# Patient Record
Sex: Male | Born: 1950
Health system: Southern US, Community
[De-identification: ages and names within clinical notes are randomized; demographics above are authoritative.]

## PROBLEM LIST (undated history)

## (undated) DIAGNOSIS — R0602 Shortness of breath: Secondary | ICD-10-CM

## (undated) DIAGNOSIS — Z8709 Personal history of other diseases of the respiratory system: Secondary | ICD-10-CM

## (undated) DIAGNOSIS — K219 Gastro-esophageal reflux disease without esophagitis: Secondary | ICD-10-CM

## (undated) DIAGNOSIS — I10 Essential (primary) hypertension: Secondary | ICD-10-CM

## (undated) DIAGNOSIS — E785 Hyperlipidemia, unspecified: Secondary | ICD-10-CM

## (undated) DIAGNOSIS — R51 Headache: Secondary | ICD-10-CM

## (undated) DIAGNOSIS — Z720 Tobacco use: Secondary | ICD-10-CM

## (undated) DIAGNOSIS — I2510 Atherosclerotic heart disease of native coronary artery without angina pectoris: Secondary | ICD-10-CM

## (undated) DIAGNOSIS — L409 Psoriasis, unspecified: Secondary | ICD-10-CM

## (undated) HISTORY — PX: LEG SURGERY: SHX1003

## (undated) HISTORY — PX: LIVER SURGERY: SHX698

## (undated) HISTORY — PX: SPLENECTOMY, TOTAL: SHX788

---

## 1978-08-10 HISTORY — PX: LUMBAR SPINE SURGERY: SHX701

## 2012-08-19 ENCOUNTER — Other Ambulatory Visit: Payer: Self-pay | Admitting: Neurosurgery

## 2012-08-19 ENCOUNTER — Ambulatory Visit
Admission: RE | Admit: 2012-08-19 | Discharge: 2012-08-19 | Disposition: A | Discharge status: Home / Self Care | Payer: Self-pay | Source: Ambulatory Visit | Attending: Neurosurgery | Admitting: Neurosurgery

## 2012-08-19 DIAGNOSIS — M542 Cervicalgia: Secondary | ICD-10-CM

## 2012-12-27 ENCOUNTER — Other Ambulatory Visit: Payer: Self-pay | Admitting: Neurosurgery

## 2012-12-27 DIAGNOSIS — M5412 Radiculopathy, cervical region: Secondary | ICD-10-CM

## 2012-12-28 ENCOUNTER — Ambulatory Visit
Admission: RE | Admit: 2012-12-28 | Discharge: 2012-12-28 | Disposition: A | Discharge status: Home / Self Care | Payer: No Typology Code available for payment source | Source: Ambulatory Visit | Attending: Neurosurgery | Admitting: Neurosurgery

## 2012-12-28 DIAGNOSIS — M5412 Radiculopathy, cervical region: Secondary | ICD-10-CM

## 2012-12-28 MED ORDER — GADOBENATE DIMEGLUMINE 529 MG/ML IV SOLN
15.0000 mL | Freq: Once | INTRAVENOUS | Status: AC | PRN
  Administered 2012-12-28: 15 mL via INTRAVENOUS

## 2013-01-04 ENCOUNTER — Other Ambulatory Visit: Payer: Self-pay | Admitting: Neurosurgery

## 2013-01-06 ENCOUNTER — Encounter (HOSPITAL_COMMUNITY): Admission: RE | Admit: 2013-01-06 | Payer: Self-pay | Source: Ambulatory Visit

## 2013-01-11 ENCOUNTER — Encounter (HOSPITAL_COMMUNITY): Admission: RE | Payer: Self-pay | Source: Ambulatory Visit

## 2013-01-11 ENCOUNTER — Ambulatory Visit (HOSPITAL_COMMUNITY): Admission: RE | Admit: 2013-01-11 | Payer: Self-pay | Source: Ambulatory Visit | Admitting: Neurosurgery

## 2013-01-11 SURGERY — ANTERIOR CERVICAL DECOMPRESSION/DISCECTOMY FUSION 1 LEVEL
Anesthesia: General

## 2013-04-18 ENCOUNTER — Other Ambulatory Visit: Payer: Self-pay | Admitting: Neurosurgery

## 2013-05-12 ENCOUNTER — Other Ambulatory Visit (HOSPITAL_COMMUNITY): Payer: Self-pay | Admitting: *Deleted

## 2013-05-12 NOTE — Pre-Procedure Instructions (Signed)
Gabriel Gibbs  05/12/2013   Your procedure is scheduled on:  Wednesday, May 24, 2013 at 9:00 AM.   Report to [facility] Main Entrance "A" at 7:00 AM.   Call this number if you have problems the morning of surgery: [phone]   Remember:   Do not eat food or drink liquids after midnight Tuesday, 05/23/13.   Take these medicines the morning of surgery with A SIP OF WATER: oxyCODONE-acetaminophen (PERCOCET) - if needed, methylPREDNISolone (MEDROL DOSEPAK)     Do not wear jewelry.  Do not wear lotions, powders, or cologne. You may wear deodorant.  Do not shave 48 hours prior to surgery. Men may shave face and neck.  Do not bring valuables to the hospital.  [facility] is not responsible                  for any belongings or valuables.               Contacts, dentures or bridgework may not be worn into surgery.  Leave suitcase in the car. After surgery it may be brought to your room.  For patients admitted to the hospital, discharge time is determined by your                treatment team.              Special Instructions: Shower using CHG 2 nights before surgery and the night before surgery.  If you shower the day of surgery use CHG.  Use special wash - you have one bottle of CHG for all showers.  You should use approximately 1/3 of the bottle for each shower.   Please read over the following fact sheets that you were given: Pain Booklet, Coughing and Deep Breathing, MRSA Information and Surgical Site Infection Prevention

## 2013-05-15 ENCOUNTER — Encounter (HOSPITAL_COMMUNITY): Payer: Self-pay | Admitting: Pharmacy Technician

## 2013-05-15 ENCOUNTER — Ambulatory Visit (HOSPITAL_COMMUNITY)
Admission: RE | Admit: 2013-05-15 | Discharge: 2013-05-15 | Disposition: A | Discharge status: Home / Self Care | Payer: PRIVATE HEALTH INSURANCE | Source: Ambulatory Visit | Attending: Anesthesiology | Admitting: Anesthesiology

## 2013-05-15 ENCOUNTER — Encounter (HOSPITAL_COMMUNITY): Payer: Self-pay

## 2013-05-15 ENCOUNTER — Encounter (HOSPITAL_COMMUNITY)
Admission: RE | Admit: 2013-05-15 | Discharge: 2013-05-15 | Disposition: A | Discharge status: Home / Self Care | Payer: PRIVATE HEALTH INSURANCE | Source: Ambulatory Visit | Attending: Neurosurgery | Admitting: Neurosurgery

## 2013-05-15 DIAGNOSIS — Z01818 Encounter for other preprocedural examination: Secondary | ICD-10-CM | POA: Insufficient documentation

## 2013-05-15 DIAGNOSIS — Z01810 Encounter for preprocedural cardiovascular examination: Secondary | ICD-10-CM | POA: Insufficient documentation

## 2013-05-15 DIAGNOSIS — Z01812 Encounter for preprocedural laboratory examination: Secondary | ICD-10-CM | POA: Insufficient documentation

## 2013-05-15 DIAGNOSIS — R9431 Abnormal electrocardiogram [ECG] [EKG]: Secondary | ICD-10-CM | POA: Insufficient documentation

## 2013-05-15 HISTORY — DX: Shortness of breath: R06.02

## 2013-05-15 HISTORY — DX: Headache: R51

## 2013-05-15 HISTORY — DX: Personal history of other diseases of the respiratory system: Z87.09

## 2013-05-15 HISTORY — DX: Psoriasis, unspecified: L40.9

## 2013-05-15 LAB — BASIC METABOLIC PANEL
BUN: 23 mg/dL (ref 6–23)
Calcium: 9 mg/dL (ref 8.4–10.5)
Chloride: 105 mEq/L (ref 96–112)
Creatinine, Ser: 0.69 mg/dL (ref 0.50–1.35)
GFR calc Af Amer: >90 mL/min (ref >90)
Sodium: 141 mEq/L (ref 135–145)

## 2013-05-15 LAB — CBC
MCH: 34 pg (ref 26.0–34.0)
MCV: 95.7 fL (ref 78.0–100.0)
Platelets: 338 10*3/uL (ref 150–400)
RBC: 4.68 MIL/uL (ref 4.22–5.81)
RDW: 13.7 % (ref 11.5–15.5)
WBC: 9.4 10*3/uL (ref 4.0–10.5)

## 2013-05-15 LAB — SURGICAL PCR SCREEN: Staphylococcus aureus: NEGATIVE

## 2013-05-23 MED ORDER — CEFAZOLIN SODIUM-DEXTROSE 2-3 GM-% IV SOLR
2.0000 g | INTRAVENOUS | Status: AC
  Administered 2013-05-24: 2 g via INTRAVENOUS
  Filled 2013-05-23: qty 50

## 2013-05-24 ENCOUNTER — Encounter (HOSPITAL_COMMUNITY): Payer: Self-pay | Admitting: Anesthesiology

## 2013-05-24 ENCOUNTER — Inpatient Hospital Stay (HOSPITAL_COMMUNITY)
Admission: RE | Admit: 2013-05-24 | Discharge: 2013-05-26 | DRG: 473 | Disposition: A | Discharge status: Home / Self Care | Payer: PRIVATE HEALTH INSURANCE | Source: Ambulatory Visit | Attending: Neurosurgery | Admitting: Neurosurgery

## 2013-05-24 ENCOUNTER — Ambulatory Visit (HOSPITAL_COMMUNITY): Payer: PRIVATE HEALTH INSURANCE

## 2013-05-24 ENCOUNTER — Encounter (HOSPITAL_COMMUNITY)
Admission: RE | Disposition: A | Discharge status: Home / Self Care | Payer: Self-pay | Source: Ambulatory Visit | Attending: Neurosurgery

## 2013-05-24 ENCOUNTER — Encounter (HOSPITAL_COMMUNITY): Payer: PRIVATE HEALTH INSURANCE | Admitting: Anesthesiology

## 2013-05-24 ENCOUNTER — Ambulatory Visit (HOSPITAL_COMMUNITY): Payer: PRIVATE HEALTH INSURANCE | Admitting: Anesthesiology

## 2013-05-24 DIAGNOSIS — R110 Nausea: Secondary | ICD-10-CM | POA: Diagnosis not present

## 2013-05-24 DIAGNOSIS — L408 Other psoriasis: Secondary | ICD-10-CM | POA: Diagnosis present

## 2013-05-24 DIAGNOSIS — F17200 Nicotine dependence, unspecified, uncomplicated: Secondary | ICD-10-CM | POA: Diagnosis present

## 2013-05-24 DIAGNOSIS — Z79899 Other long term (current) drug therapy: Secondary | ICD-10-CM

## 2013-05-24 DIAGNOSIS — M47812 Spondylosis without myelopathy or radiculopathy, cervical region: Principal | ICD-10-CM | POA: Diagnosis present

## 2013-05-24 HISTORY — PX: ANTERIOR CERVICAL DECOMP/DISCECTOMY FUSION: SHX1161

## 2013-05-24 SURGERY — ANTERIOR CERVICAL DECOMPRESSION/DISCECTOMY FUSION 1 LEVEL
Anesthesia: General | Site: Spine Cervical | Wound class: Clean

## 2013-05-24 MED ORDER — THROMBIN 5000 UNITS EX SOLR
CUTANEOUS | Status: DC | PRN
  Administered 2013-05-24 (×2): 5000 [IU] via TOPICAL

## 2013-05-24 MED ORDER — ONDANSETRON HCL 4 MG/2ML IJ SOLN
INTRAMUSCULAR | Status: DC | PRN
  Administered 2013-05-24: 4 mg via INTRAMUSCULAR

## 2013-05-24 MED ORDER — ACETAMINOPHEN 650 MG RE SUPP: 650.0000 mg | RECTAL | Status: DC | PRN

## 2013-05-24 MED ORDER — OXYCODONE HCL 5 MG PO TABS
ORAL_TABLET | ORAL | Status: AC
  Filled 2013-05-24: qty 1

## 2013-05-24 MED ORDER — SODIUM CHLORIDE 0.9 % IJ SOLN
3.0000 mL | Freq: Two times a day (BID) | INTRAMUSCULAR | Status: DC
  Administered 2013-05-24 – 2013-05-25 (×4): 3 mL via INTRAVENOUS

## 2013-05-24 MED ORDER — GLYCOPYRROLATE 0.2 MG/ML IJ SOLN
INTRAMUSCULAR | Status: DC | PRN
  Administered 2013-05-24: 0.4 mg via INTRAVENOUS

## 2013-05-24 MED ORDER — MIDAZOLAM HCL 5 MG/5ML IJ SOLN
INTRAMUSCULAR | Status: DC | PRN
  Administered 2013-05-24 (×2): 2 mg via INTRAVENOUS

## 2013-05-24 MED ORDER — LIDOCAINE HCL (CARDIAC) 20 MG/ML IV SOLN
INTRAVENOUS | Status: DC | PRN
  Administered 2013-05-24: 60 mg via INTRAVENOUS

## 2013-05-24 MED ORDER — ONDANSETRON HCL 4 MG/2ML IJ SOLN
4.0000 mg | INTRAMUSCULAR | Status: DC | PRN
  Administered 2013-05-25: 4 mg via INTRAVENOUS
  Filled 2013-05-24: qty 2

## 2013-05-24 MED ORDER — SODIUM CHLORIDE 0.9 % IJ SOLN: 3.0000 mL | INTRAMUSCULAR | Status: DC | PRN

## 2013-05-24 MED ORDER — OXYCODONE-ACETAMINOPHEN 5-325 MG PO TABS
ORAL_TABLET | ORAL | Status: AC
  Filled 2013-05-24: qty 1

## 2013-05-24 MED ORDER — NEOSTIGMINE METHYLSULFATE 1 MG/ML IJ SOLN
INTRAMUSCULAR | Status: DC | PRN
  Administered 2013-05-24: 3 mg via INTRAVENOUS

## 2013-05-24 MED ORDER — DEXAMETHASONE SODIUM PHOSPHATE 10 MG/ML IJ SOLN
10.0000 mg | Freq: Once | INTRAMUSCULAR | Status: AC
  Administered 2013-05-24: 10 mg via INTRAVENOUS
  Filled 2013-05-24 (×2): qty 1

## 2013-05-24 MED ORDER — HYDROMORPHONE HCL PF 1 MG/ML IJ SOLN
INTRAMUSCULAR | Status: AC
  Filled 2013-05-24: qty 1

## 2013-05-24 MED ORDER — ALBUTEROL SULFATE HFA 108 (90 BASE) MCG/ACT IN AERS
2.0000 | INHALATION_SPRAY | Freq: Four times a day (QID) | RESPIRATORY_TRACT | Status: DC | PRN
  Filled 2013-05-24: qty 6.7

## 2013-05-24 MED ORDER — ROCURONIUM BROMIDE 100 MG/10ML IV SOLN
INTRAVENOUS | Status: DC | PRN
  Administered 2013-05-24: 50 mg via INTRAVENOUS

## 2013-05-24 MED ORDER — DOCUSATE SODIUM 100 MG PO CAPS
100.0000 mg | ORAL_CAPSULE | Freq: Two times a day (BID) | ORAL | Status: DC
  Administered 2013-05-24 – 2013-05-25 (×3): 100 mg via ORAL
  Filled 2013-05-24 (×3): qty 1

## 2013-05-24 MED ORDER — THROMBIN 5000 UNITS EX SOLR
OROMUCOSAL | Status: DC | PRN
  Administered 2013-05-24: 11:00:00 via TOPICAL

## 2013-05-24 MED ORDER — HYDROMORPHONE HCL PF 1 MG/ML IJ SOLN
0.5000 mg | INTRAMUSCULAR | Status: DC | PRN
  Administered 2013-05-24 – 2013-05-25 (×5): 1 mg via INTRAVENOUS
  Filled 2013-05-24 (×5): qty 1

## 2013-05-24 MED ORDER — LACTATED RINGERS IV SOLN
INTRAVENOUS | Status: DC | PRN
  Administered 2013-05-24 (×2): via INTRAVENOUS

## 2013-05-24 MED ORDER — OXYCODONE-ACETAMINOPHEN 5-325 MG PO TABS
1.0000 | ORAL_TABLET | ORAL | Status: DC | PRN
  Administered 2013-05-24 – 2013-05-26 (×4): 1 via ORAL
  Filled 2013-05-24 (×4): qty 1

## 2013-05-24 MED ORDER — 0.9 % SODIUM CHLORIDE (POUR BTL) OPTIME
TOPICAL | Status: DC | PRN
  Administered 2013-05-24: 1000 mL

## 2013-05-24 MED ORDER — OXYCODONE HCL 5 MG PO TABS
5.0000 mg | ORAL_TABLET | ORAL | Status: DC | PRN
  Administered 2013-05-24 – 2013-05-26 (×4): 5 mg via ORAL
  Filled 2013-05-24 (×4): qty 1

## 2013-05-24 MED ORDER — ONDANSETRON HCL 4 MG/2ML IJ SOLN: 4.0000 mg | Freq: Once | INTRAMUSCULAR | Status: DC | PRN

## 2013-05-24 MED ORDER — OXYCODONE-ACETAMINOPHEN 5-325 MG PO TABS
1.0000 | ORAL_TABLET | Freq: Four times a day (QID) | ORAL | Status: DC | PRN
  Administered 2013-05-24: 1 via ORAL

## 2013-05-24 MED ORDER — FENTANYL CITRATE 0.05 MG/ML IJ SOLN
INTRAMUSCULAR | Status: DC | PRN
  Administered 2013-05-24: 50 ug via INTRAVENOUS
  Administered 2013-05-24 (×2): 100 ug via INTRAVENOUS

## 2013-05-24 MED ORDER — DEXAMETHASONE SODIUM PHOSPHATE 10 MG/ML IJ SOLN
10.0000 mg | INTRAMUSCULAR | Status: AC
  Administered 2013-05-24: 10 mg via INTRAVENOUS
  Filled 2013-05-24: qty 1

## 2013-05-24 MED ORDER — ALUM & MAG HYDROXIDE-SIMETH 200-200-20 MG/5ML PO SUSP
30.0000 mL | Freq: Four times a day (QID) | ORAL | Status: DC | PRN
  Administered 2013-05-26: 30 mL via ORAL
  Filled 2013-05-24: qty 30

## 2013-05-24 MED ORDER — CARISOPRODOL 350 MG PO TABS
350.0000 mg | ORAL_TABLET | Freq: Four times a day (QID) | ORAL | Status: DC
  Administered 2013-05-24 – 2013-05-25 (×6): 350 mg via ORAL
  Filled 2013-05-24 (×6): qty 1

## 2013-05-24 MED ORDER — PROPOFOL 10 MG/ML IV BOLUS
INTRAVENOUS | Status: DC | PRN
  Administered 2013-05-24: 160 mg via INTRAVENOUS

## 2013-05-24 MED ORDER — SODIUM CHLORIDE 0.9 % IR SOLN
Status: DC | PRN
  Administered 2013-05-24: 09:00:00

## 2013-05-24 MED ORDER — DEXAMETHASONE SODIUM PHOSPHATE 10 MG/ML IJ SOLN
10.0000 mg | Freq: Four times a day (QID) | INTRAMUSCULAR | Status: DC
  Administered 2013-05-24 – 2013-05-26 (×6): 10 mg via INTRAVENOUS
  Filled 2013-05-24 (×11): qty 1

## 2013-05-24 MED ORDER — CYCLOBENZAPRINE HCL 10 MG PO TABS
ORAL_TABLET | ORAL | Status: AC
  Filled 2013-05-24: qty 1

## 2013-05-24 MED ORDER — HYDROMORPHONE HCL PF 1 MG/ML IJ SOLN
INTRAMUSCULAR | Status: DC | PRN
  Administered 2013-05-24 (×2): 0.5 mg via INTRAVENOUS

## 2013-05-24 MED ORDER — PANTOPRAZOLE SODIUM 40 MG PO TBEC
40.0000 mg | DELAYED_RELEASE_TABLET | Freq: Two times a day (BID) | ORAL | Status: DC
  Administered 2013-05-24 – 2013-05-25 (×3): 40 mg via ORAL
  Filled 2013-05-24 (×3): qty 1

## 2013-05-24 MED ORDER — MENTHOL 3 MG MT LOZG: 1.0000 | LOZENGE | OROMUCOSAL | Status: DC | PRN

## 2013-05-24 MED ORDER — CYCLOBENZAPRINE HCL 10 MG PO TABS
10.0000 mg | ORAL_TABLET | Freq: Three times a day (TID) | ORAL | Status: DC | PRN
  Administered 2013-05-24 – 2013-05-25 (×3): 10 mg via ORAL
  Filled 2013-05-24 (×2): qty 1

## 2013-05-24 MED ORDER — OXYCODONE-ACETAMINOPHEN 10-325 MG PO TABS: 1.0000 | ORAL_TABLET | Freq: Four times a day (QID) | ORAL | Status: DC | PRN

## 2013-05-24 MED ORDER — HEMOSTATIC AGENTS (NO CHARGE) OPTIME
TOPICAL | Status: DC | PRN
  Administered 2013-05-24: 1 via TOPICAL

## 2013-05-24 MED ORDER — PHENOL 1.4 % MT LIQD: 1.0000 | OROMUCOSAL | Status: DC | PRN

## 2013-05-24 MED ORDER — CEFAZOLIN SODIUM 1-5 GM-% IV SOLN
1.0000 g | Freq: Three times a day (TID) | INTRAVENOUS | Status: AC
  Administered 2013-05-24 – 2013-05-25 (×2): 1 g via INTRAVENOUS
  Filled 2013-05-24 (×2): qty 50

## 2013-05-24 MED ORDER — LACTATED RINGERS IV SOLN
INTRAVENOUS | Status: DC
Start: 2013-05-24 — End: 2013-05-26
  Administered 2013-05-24: 08:00:00 via INTRAVENOUS

## 2013-05-24 MED ORDER — ACETAMINOPHEN 325 MG PO TABS: 650.0000 mg | ORAL_TABLET | ORAL | Status: DC | PRN

## 2013-05-24 MED ORDER — OXYCODONE HCL 5 MG PO TABS
5.0000 mg | ORAL_TABLET | Freq: Four times a day (QID) | ORAL | Status: DC | PRN
  Administered 2013-05-24: 5 mg via ORAL

## 2013-05-24 MED ORDER — HYDROMORPHONE HCL PF 1 MG/ML IJ SOLN
0.2500 mg | INTRAMUSCULAR | Status: DC | PRN
  Administered 2013-05-24 (×4): 0.5 mg via INTRAVENOUS

## 2013-05-24 SURGICAL SUPPLY — 59 items
BAG DECANTER FOR FLEXI CONT (MISCELLANEOUS) ×2 IMPLANT
BENZOIN TINCTURE PRP APPL 2/3 (GAUZE/BANDAGES/DRESSINGS) ×2 IMPLANT
BIT DRILL SM SPINE QC 14 (BIT) ×2 IMPLANT
BONE CERV LORDOTIC 14.5X12X7 (Bone Implant) ×2 IMPLANT
BRUSH SCRUB EZ PLAIN DRY (MISCELLANEOUS) ×2 IMPLANT
BUR MATCHSTICK NEURO 3.0 LAGG (BURR) ×2 IMPLANT
CANISTER SUCTION 2500CC (MISCELLANEOUS) ×2 IMPLANT
CONT SPEC 4OZ CLIKSEAL STRL BL (MISCELLANEOUS) ×2 IMPLANT
DERMABOND ADVANCED (GAUZE/BANDAGES/DRESSINGS)
DERMABOND ADVANCED .7 DNX12 (GAUZE/BANDAGES/DRESSINGS) IMPLANT
DRAPE C-ARM 42X72 X-RAY (DRAPES) ×4 IMPLANT
DRAPE LAPAROTOMY 100X72 PEDS (DRAPES) ×2 IMPLANT
DRAPE MICROSCOPE ZEISS OPMI (DRAPES) ×2 IMPLANT
DRAPE POUCH INSTRU U-SHP 10X18 (DRAPES) ×2 IMPLANT
DRSG OPSITE 4X5.5 SM (GAUZE/BANDAGES/DRESSINGS) IMPLANT
DRSG OPSITE POSTOP 3X4 (GAUZE/BANDAGES/DRESSINGS) ×2 IMPLANT
DURAPREP 6ML APPLICATOR 50/CS (WOUND CARE) ×2 IMPLANT
ELECT COATED BLADE 2.86 ST (ELECTRODE) ×2 IMPLANT
ELECT REM PT RETURN 9FT ADLT (ELECTROSURGICAL) ×2
ELECTRODE REM PT RTRN 9FT ADLT (ELECTROSURGICAL) ×1 IMPLANT
GAUZE SPONGE 4X4 16PLY XRAY LF (GAUZE/BANDAGES/DRESSINGS) IMPLANT
GLOVE BIO SURGEON STRL SZ8 (GLOVE) ×2 IMPLANT
GLOVE BIOGEL PI IND STRL 7.0 (GLOVE) ×3 IMPLANT
GLOVE BIOGEL PI INDICATOR 7.0 (GLOVE) ×3
GLOVE ECLIPSE 6.5 STRL STRAW (GLOVE) ×2 IMPLANT
GLOVE EXAM NITRILE LRG STRL (GLOVE) IMPLANT
GLOVE EXAM NITRILE MD LF STRL (GLOVE) IMPLANT
GLOVE EXAM NITRILE XL STR (GLOVE) IMPLANT
GLOVE EXAM NITRILE XS STR PU (GLOVE) IMPLANT
GLOVE INDICATOR 8.5 STRL (GLOVE) ×2 IMPLANT
GLOVE SS BIOGEL STRL SZ 6.5 (GLOVE) ×3 IMPLANT
GLOVE SUPERSENSE BIOGEL SZ 6.5 (GLOVE) ×3
GOWN BRE IMP SLV AUR LG STRL (GOWN DISPOSABLE) ×6 IMPLANT
GOWN BRE IMP SLV AUR XL STRL (GOWN DISPOSABLE) ×2 IMPLANT
GOWN STRL REIN 2XL LVL4 (GOWN DISPOSABLE) IMPLANT
HEAD HALTER (SOFTGOODS) ×2 IMPLANT
HEMOSTAT POWDER KIT SURGIFOAM (HEMOSTASIS) ×2 IMPLANT
KIT BASIN OR (CUSTOM PROCEDURE TRAY) ×2 IMPLANT
KIT ROOM TURNOVER OR (KITS) ×2 IMPLANT
NEEDLE HYPO 18GX1.5 BLUNT FILL (NEEDLE) IMPLANT
NEEDLE SPNL 20GX3.5 QUINCKE YW (NEEDLE) ×2 IMPLANT
NS IRRIG 1000ML POUR BTL (IV SOLUTION) ×2 IMPLANT
PACK LAMINECTOMY NEURO (CUSTOM PROCEDURE TRAY) ×2 IMPLANT
PAD ARMBOARD 7.5X6 YLW CONV (MISCELLANEOUS) ×6 IMPLANT
PLATE ANT CERV XTEND 1 LV 16 (Plate) ×2 IMPLANT
RUBBERBAND STERILE (MISCELLANEOUS) ×4 IMPLANT
SCREW XTD VAR 4.2 SELF TAP (Screw) ×8 IMPLANT
SPONGE GAUZE 4X4 12PLY (GAUZE/BANDAGES/DRESSINGS) IMPLANT
SPONGE INTESTINAL PEANUT (DISPOSABLE) ×2 IMPLANT
SPONGE SURGIFOAM ABS GEL SZ50 (HEMOSTASIS) ×2 IMPLANT
STRIP CLOSURE SKIN 1/2X4 (GAUZE/BANDAGES/DRESSINGS) ×2 IMPLANT
SUT VIC AB 3-0 SH 8-18 (SUTURE) ×2 IMPLANT
SUT VICRYL 4-0 PS2 18IN ABS (SUTURE) ×2 IMPLANT
SYR 20ML ECCENTRIC (SYRINGE) ×2 IMPLANT
TAPE CLOTH 4X10 WHT NS (GAUZE/BANDAGES/DRESSINGS) IMPLANT
TOWEL OR 17X24 6PK STRL BLUE (TOWEL DISPOSABLE) ×2 IMPLANT
TOWEL OR 17X26 10 PK STRL BLUE (TOWEL DISPOSABLE) ×2 IMPLANT
TRAP SPECIMEN MUCOUS 40CC (MISCELLANEOUS) ×2 IMPLANT
WATER STERILE IRR 1000ML POUR (IV SOLUTION) ×2 IMPLANT

## 2013-05-24 NOTE — Plan of Care (Signed)
Problem: Consults Goal: Diagnosis - Spinal Surgery Outcome: Completed/Met Date Met:  05/24/13 Cervical Spine Fusion     

## 2013-05-24 NOTE — Anesthesia Postprocedure Evaluation (Signed)
  Anesthesia Post-op Note  Patient: Gabriel Gibbs  Procedure(s) Performed: Procedure(s): CERVICAL FOUR-FIVE ANTERIOR CERVICAL DISCECTOMY FUSION (N/A)  Patient Location: PACU  Anesthesia Type:General  Level of Consciousness: awake, alert , oriented and patient cooperative  Airway and Oxygen Therapy: Patient Spontanous Breathing  Post-op Pain: mild  Post-op Assessment: Post-op Vital signs reviewed, Patient's Cardiovascular Status Stable, Respiratory Function Stable, Patent Airway, No signs of Nausea or vomiting and Pain level controlled  Post-op Vital Signs: stable  Complications: No apparent anesthesia complications

## 2013-05-24 NOTE — Transfer of Care (Signed)
Immediate Anesthesia Transfer of Care Note  Patient: Gabriel Gibbs  Procedure(s) Performed: Procedure(s): CERVICAL FOUR-FIVE ANTERIOR CERVICAL DISCECTOMY FUSION (N/A)  Patient Location: PACU  Anesthesia Type:General  Level of Consciousness: awake, alert  and patient cooperative  Airway & Oxygen Therapy: Patient Spontanous Breathing and Patient connected to nasal cannula oxygen  Post-op Assessment: Report given to PACU RN, Post -op Vital signs reviewed and stable and Patient moving all extremities  Post vital signs: Reviewed and stable  Complications: No apparent anesthesia complications

## 2013-05-24 NOTE — Op Note (Signed)
Preoperative diagnosis: Cervical spondylosis with radiculopathy and cervical stenosis at C4-5 with bilateral C5 radiculopathies right greater left  Postoperative diagnosis: Same  Procedure: Anterior cervical discectomy and fusion at C4-5 using allograft wedge and globus extend plating system with 4-14 mm screws  Surgeon: Jillyn Hidden Valta Dillon  Assistant: Joylene Draft  Anesthesia: Gen.  EBL: Minimal  History of present illness: Patient is a very pleasant ?23 M. is a progress worsening neck and right shoulder greater left shoulder arm pain is been refractory to all forms of conservative treatment imaging revealed severe cervical stenosis and foraminal stenosis of both C5 nerve roots and due to patient's failure of conservative treatment imaging findings and progression of clinical syndrome I recommended anterior cervical discectomy and fusion I extensively reviewed the risks and benefits of the operation the patient as well as perioperative course expectations about alternatives of surgery and he understood and agreed to proceed forward.  Operative procedure: Patient brought into the or was induced under general anesthesia positioned supine the neck in slight extension in 5 pounds of halter traction the right side his neck was prepped and draped in routine sterile fashion. Preoperative x-ray localize the appropriate level so a curvilinear incision was made just up midline to get 4 to sternomastoid and the superficial layer of the platysmas dissected and divided longitudinally the avascular tissue sternomastoid and strap as was felt that her previously fashioned professes acid with Kitners. Interoperative x-ray confirmed the appropriate level so longus Richardson Dopp as reflected laterally and self-retaining retractors placed. Disc space was incised large anterior aspect of did not flexor rongeur and recommend Kerrison punch. The spaces and drill down the posterior annulus facet complex. Then a much illumination under biting  of the posterior aspect complex lichenification of the PLL which was removed in piecemeal fashion exposing the thecal sac. There was marked spondylosis and severe central stenosis spur predominantly from the C4 vertebral body this was aggressively under been decompress the central canal marking laterally and the uncinate processes and the markedly hypertrophied causing severe proximal stenosis of both C5 nerve roots. This was aggressively under under been removed the C5 pedicles were identified bilaterally and both C5 nerve roots skeletonized flush with pedicle. At the discectomy there is no further central or foraminal stenosis the wounds and copiously irrigated meticulous hemostasis was maintained Gelfoam was laid over the dura and irrigated out Gelfoam was removed a 7 mm allograft wedge was selected this was inserted 1-2 mm deep to the anterior vertebral body line then a 40 mm plate was selected all screws excellent purchase locking mechanisms were engaged and after meticulous hemostasis was maintained and the wounds copiously irrigated was closed with after Vicryl and platysma and a running 4 subcuticular benzoin and Steri-Strips were applied patient recovered in stable condition. At the end of case all needle counts and sponge counts were correct.

## 2013-05-24 NOTE — Anesthesia Preprocedure Evaluation (Signed)
Anesthesia Evaluation  Patient identified by MRN, date of birth, ID band Patient awake    Reviewed: Allergy & Precautions, H&P , NPO status , Patient's Chart, lab work & pertinent test results  Airway       Dental   Pulmonary COPDCurrent Smoker,          Cardiovascular     Neuro/Psych  Headaches,    GI/Hepatic   Endo/Other    Renal/GU      Musculoskeletal   Abdominal   Peds  Hematology   Anesthesia Other Findings   Reproductive/Obstetrics                           Anesthesia Physical Anesthesia Plan  ASA: II  Anesthesia Plan: General   Post-op Pain Management:    Induction: Intravenous  Airway Management Planned: Oral ETT  Additional Equipment:   Intra-op Plan:   Post-operative Plan: Extubation in OR  Informed Consent: I have reviewed the patients History and Physical, chart, labs and discussed the procedure including the risks, benefits and alternatives for the proposed anesthesia with the patient or authorized representative who has indicated his/her understanding and acceptance.     Plan Discussed with: CRNA, Anesthesiologist and Surgeon  Anesthesia Plan Comments:         Anesthesia Quick Evaluation

## 2013-05-24 NOTE — Progress Notes (Signed)
Patient ID: AZARIEL BANIK, male   DOB: Dec 25, 1950, 62 y.o.   MRN: [id number] Mr. Norlene Duel is complaining of some clumsiness with his left shoulder on exam he has weakness in the left deltoid he is able to abductor almost to a 9 angle however not more than that. His biceps are strong at 5 over 5 distally he is also strong wrist flexion extension intrinsics and tricep are all 5 out of 5 he has no new numbness and tingling in the arm and hand. His preoperative radicular pain is gone in the right side. I believe this is isolated C5 radiculitis not uncommon seen with decompression of the C5 nerve root. I will put him on IV steroid and continue to observe overnight in the morning.

## 2013-05-24 NOTE — H&P (Signed)
Gabriel Gibbs is an 62 y.o. male.   Chief Complaint: Neck and right shoulder pain HPI: Patient is a very pleasant ?22 M. is a progress worsening neck and right shoulder pain greater left this very fractured all forms of conservative and anti-inflammatories physical therapy narcotic pain management exercise. Workup revealed severe cervical spondylosis with spinal stenosis spinal cord compression and foraminal stenosis at C4-5. Due to patient's failure conservative treatment imaging findings and progression of clinical syndrome I recommended anterior cervical discectomy and fusion at C4-5. I extensively reviewed the risks and benefits of the operation with the patient as well as perioperative course and expectations of outcome and alternatives of surgery and he understood and agreed to proceed forward.  Past Medical History  Diagnosis Date  . Headache(784.0)   . Shortness of breath   . Hx of bronchitis   . Psoriasis     Past Surgical History  Procedure Laterality Date  . Splenectomy, total    . Liver surgery      part of liver removed  . Leg surgery      right  . Lumbar spine surgery  1980    History reviewed. No pertinent family history. Social History:  reports that he has been smoking Cigarettes.  He has a 90 pack-year smoking history. He has never used smokeless tobacco. His alcohol and drug histories are not on file.  Allergies: No Known Allergies  Medications Prior to Admission  Medication Sig Dispense Refill  . albuterol (PROVENTIL HFA;VENTOLIN HFA) 108 (90 BASE) MCG/ACT inhaler Inhale 2 puffs into the lungs 4 (four) times daily as needed for wheezing.      . carisoprodol (SOMA) 350 MG tablet Take 350 mg by mouth 3 (three) times daily as needed for muscle spasms.      Marland Kitchen oxyCODONE-acetaminophen (PERCOCET) 10-325 MG per tablet Take 1 tablet by mouth 3 (three) times daily as needed for pain.      Marland Kitchen triamcinolone ointment (KENALOG) 0.5 % Apply 1 application topically as needed.  Applies to buttock & elbows        No results found for this or any previous visit (from the past 48 hour(s)). No results found.  Review of Systems  Constitutional: Negative.   Eyes: Negative.   Respiratory: Negative.   Cardiovascular: Negative.   Gastrointestinal: Negative.   Genitourinary: Negative.   Musculoskeletal: Positive for joint pain and neck pain.  Skin: Negative.   Neurological: Positive for tingling and sensory change.  Endo/Heme/Allergies: Negative.   Psychiatric/Behavioral: Negative.     Blood pressure 161/79, pulse 87, temperature 97.5 F (36.4 C), temperature source Oral, resp. rate 20, SpO2 95.00%. Physical Exam  Constitutional: He is oriented to person, place, and time. He appears well-developed and well-nourished.  HENT:  Head: Normocephalic.  Eyes: Pupils are equal, round, and reactive to light.  Neck: Normal range of motion.  Respiratory: Breath sounds normal.  GI: Soft.  Neurological: He is alert and oriented to person, place, and time. He has normal strength. GCS eye subscore is 4. GCS verbal subscore is 5. GCS motor subscore is 6.  Reflex Scores:      Patellar reflexes are 0 on the right side and 0 on the left side.      Achilles reflexes are 0 on the right side and 0 on the left side. Patient has 5 out of 5 strength in his deltoids bilaterally left upper extremity is 5 out of 5 in his biceps, triceps, wrist flexion, wrist extension, and intrinsics.  Right upper to me has trace weakness of his right biceps at 4+ out of 5 otherwise 5 out of 5.  Skin: Skin is warm and dry.     Assessment/Plan 60 years and presents for ACDF at C4-5.  Sakoya Win P 05/24/2013, 9:24 AM

## 2013-05-25 ENCOUNTER — Encounter (HOSPITAL_COMMUNITY): Payer: Self-pay | Admitting: Neurosurgery

## 2013-05-25 MED ORDER — CALCIUM CARBONATE ANTACID 500 MG PO CHEW
2.0000 | CHEWABLE_TABLET | Freq: Two times a day (BID) | ORAL | Status: DC | PRN
  Filled 2013-05-25: qty 2

## 2013-05-25 MED ORDER — CALCIUM CARBONATE ANTACID 500 MG PO CHEW
1.0000 | CHEWABLE_TABLET | Freq: Two times a day (BID) | ORAL | Status: DC | PRN
  Administered 2013-05-25: 200 mg via ORAL
  Filled 2013-05-25: qty 1

## 2013-05-25 NOTE — Progress Notes (Signed)
Subjective: Patient reports Overall is doing well much better with his pain control however significant nausea and still with difficulty coordination the left shoulder  Objective: Vital signs in last 24 hours: Temp:  [96.8 F (36 C)-99.4 F (37.4 C)] 97.8 F (36.6 C) (10/16 0329) Pulse Rate:  [51-89] 72 (10/16 0329) Resp:  [13-20] 18 (10/16 0329) BP: (108-161)/(45-79) 143/79 mmHg (10/16 0329) SpO2:  [87 %-96 %] 93 % (10/16 0329)  Intake/Output from previous day: 10/15 0701 - 10/16 0700 In: 2740 [P.O.:840; I.V.:1900] Out: -  Intake/Output this shift:    Awake alert and wound clean and dry still with less C5 neurapraxia  Lab Results: No results found for this basename: WBC, HGB, HCT, PLT,  in the last 72 hours BMET No results found for this basename: NA, K, CL, CO2, GLUCOSE, BUN, CREATININE, CALCIUM,  in the last 72 hours  Studies/Results: Dg Cervical Spine 1 View  05/24/2013   CLINICAL DATA:  C4-5 ACDF.  EXAM: DG CERVICAL SPINE - 1 VIEW; DG C-ARM 1-60 MIN  COMPARISON:  MRI cervical spine 08/19/2012.  FINDINGS: Single single intraoperative fluoroscopic spot view of the cervical spine in the lateral projection is provided. Image demonstrates anterior plate and screws and interbody spacer at C4-5. Vertebral body height and alignment are maintained.  IMPRESSION: C4-5 ACDF.   Electronically Signed   By: Drusilla Kanner M.D.   On: 05/24/2013 11:26   Dg C-arm 1-60 Min  05/24/2013   CLINICAL DATA:  C4-5 ACDF.  EXAM: DG CERVICAL SPINE - 1 VIEW; DG C-ARM 1-60 MIN  COMPARISON:  MRI cervical spine 08/19/2012.  FINDINGS: Single single intraoperative fluoroscopic spot view of the cervical spine in the lateral projection is provided. Image demonstrates anterior plate and screws and interbody spacer at C4-5. Vertebral body height and alignment are maintained.  IMPRESSION: C4-5 ACDF.   Electronically Signed   By: Drusilla Kanner M.D.   On: 05/24/2013 11:26    Assessment/Plan: We'll need to  continue to work on control his nausea lactic inverted inpatient status continue IV Decadron for his C5 nerve root neurapraxia  LOS: 1 day    Fadumo Heng P 05/25/2013, 6:29 AM

## 2013-05-26 MED ORDER — CYCLOBENZAPRINE HCL 10 MG PO TABS: 10.0000 mg | ORAL_TABLET | Freq: Three times a day (TID) | ORAL | Status: DC | PRN

## 2013-05-26 MED ORDER — OXYCODONE HCL 5 MG PO TABS: 15.0000 mg | ORAL_TABLET | ORAL | Status: DC | PRN

## 2013-05-26 NOTE — Progress Notes (Signed)
Pt given D/C instructions with Rx's, verbal understanding was given. Pt D/C'd home via wheelchair @ 1245 per MD order. Rema Fendt, RN

## 2013-05-26 NOTE — Progress Notes (Signed)
Utilization review complete. Wess Baney RN CCM Case Mgmt  

## 2013-05-26 NOTE — Discharge Summary (Signed)
  Physician Discharge Summary  Patient ID: Gabriel Gibbs MRN: [id number] DOB/AGE: October 07, 1950 62 y.o.  Admit date: 05/24/2013 Discharge date: 05/26/2013  Admission Diagnoses: Cervical spondylosis with stenosis C4-5 to  Discharge Diagnoses: Same Active Problems:   * No active hospital problems. *   Discharged Condition: good  Hospital Course: Patient admitted hospital underwent ACDF at C4-5 postoperatively patient did fairly well however did experience left deltoid weakness. He was all isolated C5 neurapraxia I treated this with steroid patient improved to the time of discharge in a stable enough to go home was scheduled followup approximately 1-2 weeks.  Consults: Significant Diagnostic Studies: Treatments: ACDF C4-5 Discharge Exam: Blood pressure 157/90, pulse 91, temperature 98.8 F (37.1 C), temperature source Oral, resp. rate 18, SpO2 94.00%. Awake alert strength right upper extremity 5 out of 5 left upper extremity 3/5 in the left deltoid otherwise 5 out of 5  Disposition: Home     Medication List         albuterol 108 (90 BASE) MCG/ACT inhaler  Commonly known as:  PROVENTIL HFA;VENTOLIN HFA  Inhale 2 puffs into the lungs 4 (four) times daily as needed for wheezing.     carisoprodol 350 MG tablet  Commonly known as:  SOMA  Take 350 mg by mouth 3 (three) times daily as needed for muscle spasms.     cyclobenzaprine 10 MG tablet  Commonly known as:  FLEXERIL  Take 1 tablet (10 mg total) by mouth 3 (three) times daily as needed for muscle spasms.     oxyCODONE 5 MG immediate release tablet  Commonly known as:  ROXICODONE  Take 3 tablets (15 mg total) by mouth every 4 (four) hours as needed for pain.     oxyCODONE-acetaminophen 10-325 MG per tablet  Commonly known as:  PERCOCET  Take 1 tablet by mouth 3 (three) times daily as needed for pain.     triamcinolone ointment 0.5 %  Commonly known as:  KENALOG  Apply 1 application topically as needed. Applies to  buttock & elbows           Follow-up Information   Follow up with [facility] P, MD.   Specialty:  Neurosurgery   Contact information:   [address] [place] [address] [phone]       Signed: Kenika Sahm P 05/26/2013, 10:10 AM

## 2013-05-26 NOTE — Progress Notes (Signed)
Patient ID: Gabriel Gibbs, male   DOB: 12-19-1950, 62 y.o.   MRN: [id number] Is no better with pain control swallowing well ambulating well still has left shoulder discoordination feel stable for discharge.

## 2013-06-06 ENCOUNTER — Other Ambulatory Visit: Payer: Self-pay | Admitting: Neurosurgery

## 2013-06-06 DIAGNOSIS — M5412 Radiculopathy, cervical region: Secondary | ICD-10-CM

## 2013-06-13 ENCOUNTER — Other Ambulatory Visit: Payer: Self-pay | Admitting: Neurosurgery

## 2013-06-13 ENCOUNTER — Ambulatory Visit
Admission: RE | Admit: 2013-06-13 | Discharge: 2013-06-13 | Disposition: A | Discharge status: Home / Self Care | Payer: PRIVATE HEALTH INSURANCE | Source: Ambulatory Visit | Attending: Neurosurgery | Admitting: Neurosurgery

## 2013-06-13 DIAGNOSIS — M5412 Radiculopathy, cervical region: Secondary | ICD-10-CM

## 2013-06-13 MED ORDER — GADOBENATE DIMEGLUMINE 529 MG/ML IV SOLN: 14.0000 mL | Freq: Once | INTRAVENOUS | Status: AC | PRN

## 2017-01-23 DIAGNOSIS — R0602 Shortness of breath: Secondary | ICD-10-CM

## 2017-01-23 DIAGNOSIS — I2510 Atherosclerotic heart disease of native coronary artery without angina pectoris: Secondary | ICD-10-CM | POA: Diagnosis not present

## 2017-01-23 DIAGNOSIS — I110 Hypertensive heart disease with heart failure: Secondary | ICD-10-CM

## 2017-01-23 DIAGNOSIS — J441 Chronic obstructive pulmonary disease with (acute) exacerbation: Secondary | ICD-10-CM

## 2017-01-23 DIAGNOSIS — I429 Cardiomyopathy, unspecified: Secondary | ICD-10-CM

## 2017-01-24 DIAGNOSIS — J441 Chronic obstructive pulmonary disease with (acute) exacerbation: Secondary | ICD-10-CM | POA: Diagnosis not present

## 2017-01-24 DIAGNOSIS — I429 Cardiomyopathy, unspecified: Secondary | ICD-10-CM | POA: Diagnosis not present

## 2017-01-24 DIAGNOSIS — I110 Hypertensive heart disease with heart failure: Secondary | ICD-10-CM | POA: Diagnosis not present

## 2017-01-24 DIAGNOSIS — R0602 Shortness of breath: Secondary | ICD-10-CM | POA: Diagnosis not present

## 2017-01-25 DIAGNOSIS — I429 Cardiomyopathy, unspecified: Secondary | ICD-10-CM | POA: Diagnosis not present

## 2017-01-25 DIAGNOSIS — R0602 Shortness of breath: Secondary | ICD-10-CM | POA: Diagnosis not present

## 2017-01-25 DIAGNOSIS — J441 Chronic obstructive pulmonary disease with (acute) exacerbation: Secondary | ICD-10-CM | POA: Diagnosis not present

## 2017-01-25 DIAGNOSIS — I110 Hypertensive heart disease with heart failure: Secondary | ICD-10-CM | POA: Diagnosis not present

## 2017-01-26 DIAGNOSIS — I110 Hypertensive heart disease with heart failure: Secondary | ICD-10-CM | POA: Diagnosis not present

## 2017-01-26 DIAGNOSIS — J441 Chronic obstructive pulmonary disease with (acute) exacerbation: Secondary | ICD-10-CM | POA: Diagnosis not present

## 2017-01-26 DIAGNOSIS — I429 Cardiomyopathy, unspecified: Secondary | ICD-10-CM | POA: Diagnosis not present

## 2017-01-26 DIAGNOSIS — R0602 Shortness of breath: Secondary | ICD-10-CM | POA: Diagnosis not present

## 2017-08-21 ENCOUNTER — Emergency Department (HOSPITAL_COMMUNITY): Payer: Medicare HMO

## 2017-08-21 ENCOUNTER — Inpatient Hospital Stay (HOSPITAL_COMMUNITY)
Admission: EM | Admit: 2017-08-21 | Discharge: 2017-08-24 | DRG: 189 | Disposition: A | Discharge status: Home / Self Care | Payer: Medicare HMO | Attending: Internal Medicine | Admitting: Internal Medicine

## 2017-08-21 ENCOUNTER — Other Ambulatory Visit: Payer: Self-pay

## 2017-08-21 ENCOUNTER — Encounter (HOSPITAL_COMMUNITY): Payer: Self-pay | Admitting: Emergency Medicine

## 2017-08-21 DIAGNOSIS — M545 Low back pain: Secondary | ICD-10-CM | POA: Diagnosis not present

## 2017-08-21 DIAGNOSIS — Z981 Arthrodesis status: Secondary | ICD-10-CM

## 2017-08-21 DIAGNOSIS — R0902 Hypoxemia: Secondary | ICD-10-CM

## 2017-08-21 DIAGNOSIS — I10 Essential (primary) hypertension: Secondary | ICD-10-CM

## 2017-08-21 DIAGNOSIS — J069 Acute upper respiratory infection, unspecified: Secondary | ICD-10-CM | POA: Diagnosis present

## 2017-08-21 DIAGNOSIS — J9621 Acute and chronic respiratory failure with hypoxia: Secondary | ICD-10-CM | POA: Diagnosis not present

## 2017-08-21 DIAGNOSIS — R000 Tachycardia, unspecified: Secondary | ICD-10-CM

## 2017-08-21 DIAGNOSIS — I472 Ventricular tachycardia: Secondary | ICD-10-CM | POA: Diagnosis present

## 2017-08-21 DIAGNOSIS — Z7982 Long term (current) use of aspirin: Secondary | ICD-10-CM

## 2017-08-21 DIAGNOSIS — E785 Hyperlipidemia, unspecified: Secondary | ICD-10-CM | POA: Diagnosis present

## 2017-08-21 DIAGNOSIS — J441 Chronic obstructive pulmonary disease with (acute) exacerbation: Secondary | ICD-10-CM | POA: Diagnosis present

## 2017-08-21 DIAGNOSIS — G8929 Other chronic pain: Secondary | ICD-10-CM | POA: Diagnosis present

## 2017-08-21 DIAGNOSIS — L409 Psoriasis, unspecified: Secondary | ICD-10-CM | POA: Diagnosis present

## 2017-08-21 DIAGNOSIS — M549 Dorsalgia, unspecified: Secondary | ICD-10-CM | POA: Diagnosis present

## 2017-08-21 DIAGNOSIS — I2510 Atherosclerotic heart disease of native coronary artery without angina pectoris: Secondary | ICD-10-CM | POA: Diagnosis present

## 2017-08-21 DIAGNOSIS — I110 Hypertensive heart disease with heart failure: Secondary | ICD-10-CM | POA: Diagnosis present

## 2017-08-21 DIAGNOSIS — A419 Sepsis, unspecified organism: Secondary | ICD-10-CM | POA: Diagnosis present

## 2017-08-21 DIAGNOSIS — M4856XA Collapsed vertebra, not elsewhere classified, lumbar region, initial encounter for fracture: Secondary | ICD-10-CM | POA: Diagnosis present

## 2017-08-21 DIAGNOSIS — E872 Acidosis: Secondary | ICD-10-CM | POA: Diagnosis present

## 2017-08-21 DIAGNOSIS — F17210 Nicotine dependence, cigarettes, uncomplicated: Secondary | ICD-10-CM | POA: Diagnosis present

## 2017-08-21 DIAGNOSIS — Z79899 Other long term (current) drug therapy: Secondary | ICD-10-CM

## 2017-08-21 DIAGNOSIS — I5022 Chronic systolic (congestive) heart failure: Secondary | ICD-10-CM | POA: Diagnosis present

## 2017-08-21 DIAGNOSIS — Z9081 Acquired absence of spleen: Secondary | ICD-10-CM

## 2017-08-21 DIAGNOSIS — Z7902 Long term (current) use of antithrombotics/antiplatelets: Secondary | ICD-10-CM

## 2017-08-21 DIAGNOSIS — Z955 Presence of coronary angioplasty implant and graft: Secondary | ICD-10-CM

## 2017-08-21 DIAGNOSIS — R079 Chest pain, unspecified: Secondary | ICD-10-CM | POA: Diagnosis present

## 2017-08-21 DIAGNOSIS — R6510 Systemic inflammatory response syndrome (SIRS) of non-infectious origin without acute organ dysfunction: Secondary | ICD-10-CM | POA: Diagnosis present

## 2017-08-21 DIAGNOSIS — K219 Gastro-esophageal reflux disease without esophagitis: Secondary | ICD-10-CM | POA: Diagnosis present

## 2017-08-21 DIAGNOSIS — S32030A Wedge compression fracture of third lumbar vertebra, initial encounter for closed fracture: Secondary | ICD-10-CM

## 2017-08-21 DIAGNOSIS — Z720 Tobacco use: Secondary | ICD-10-CM | POA: Diagnosis present

## 2017-08-21 HISTORY — DX: Hyperlipidemia, unspecified: E78.5

## 2017-08-21 HISTORY — DX: Essential (primary) hypertension: I10

## 2017-08-21 HISTORY — DX: Gastro-esophageal reflux disease without esophagitis: K21.9

## 2017-08-21 HISTORY — DX: Tobacco use: Z72.0

## 2017-08-21 HISTORY — DX: Atherosclerotic heart disease of native coronary artery without angina pectoris: I25.10

## 2017-08-21 LAB — CBC
HCT: 45.3 % (ref 39.0–52.0)
Hemoglobin: 15.1 g/dL (ref 13.0–17.0)
MCH: 32.5 pg (ref 26.0–34.0)
MCHC: 33.3 g/dL (ref 30.0–36.0)
MCV: 97.4 fL (ref 78.0–100.0)
Platelets: 331 10*3/uL (ref 150–400)
RBC: 4.65 MIL/uL (ref 4.22–5.81)
RDW: 14.6 % (ref 11.5–15.5)
WBC: 17 10*3/uL — AB (ref 4.0–10.5)

## 2017-08-21 LAB — BASIC METABOLIC PANEL
Anion gap: 12 (ref 5–15)
BUN: 21 mg/dL — ABNORMAL HIGH (ref 6–20)
CHLORIDE: 99 mmol/L — AB (ref 101–111)
CO2: 27 mmol/L (ref 22–32)
CREATININE: 0.83 mg/dL (ref 0.61–1.24)
Calcium: 9.2 mg/dL (ref 8.9–10.3)
GFR calc non Af Amer: >60 mL/min (ref >60)
Glucose, Bld: 152 mg/dL — ABNORMAL HIGH (ref 65–99)
POTASSIUM: 4.2 mmol/L (ref 3.5–5.1)
Sodium: 138 mmol/L (ref 135–145)

## 2017-08-21 LAB — I-STAT TROPONIN, ED: Troponin i, poc: 0 ng/mL (ref 0.00–0.08)

## 2017-08-21 MED ORDER — METHYLPREDNISOLONE SODIUM SUCC 125 MG IJ SOLR
125.0000 mg | Freq: Once | INTRAMUSCULAR | Status: AC
  Administered 2017-08-21: 125 mg via INTRAVENOUS
  Filled 2017-08-21: qty 2

## 2017-08-21 MED ORDER — IOPAMIDOL (ISOVUE-370) INJECTION 76%
INTRAVENOUS | Status: AC
  Administered 2017-08-21: 100 mL
  Filled 2017-08-21: qty 100

## 2017-08-21 MED ORDER — IPRATROPIUM-ALBUTEROL 0.5-2.5 (3) MG/3ML IN SOLN
3.0000 mL | Freq: Once | RESPIRATORY_TRACT | Status: AC
  Administered 2017-08-21: 3 mL via RESPIRATORY_TRACT
  Filled 2017-08-21: qty 3

## 2017-08-21 MED ORDER — HYDROMORPHONE HCL 1 MG/ML IJ SOLN
1.0000 mg | Freq: Once | INTRAMUSCULAR | Status: AC
  Administered 2017-08-21: 1 mg via INTRAVENOUS
  Filled 2017-08-21: qty 1

## 2017-08-21 MED ORDER — SODIUM CHLORIDE 0.9 % IV BOLUS (SEPSIS)
1000.0000 mL | Freq: Once | INTRAVENOUS | Status: AC
  Administered 2017-08-21: 1000 mL via INTRAVENOUS

## 2017-08-21 MED ORDER — ONDANSETRON HCL 4 MG/2ML IJ SOLN
4.0000 mg | Freq: Once | INTRAMUSCULAR | Status: AC
  Administered 2017-08-21: 4 mg via INTRAVENOUS
  Filled 2017-08-21: qty 2

## 2017-08-21 NOTE — ED Notes (Signed)
ED Provider at bedside. 

## 2017-08-21 NOTE — ED Triage Notes (Signed)
Pt in with [facility] for back pain after doing some heavy lifting approx 5 hours ago. Pt states he felt a "pop" in his back.  Hx of chronic back and neck pain with surgeries in the past.  Pt c/o lower right back pain at this time, increasing with palpation and movement.  Pt c/o tingling to his bilateral thighs.  Pt tachycardic en route.

## 2017-08-21 NOTE — ED Notes (Signed)
Pt's HR noted to be 120 en route.  EMS then tells this RN patient c/o CP, diaphoresis and took 1 nitro 4-5 days ago at home without telling anyone.  HR noted to be irregular. EKG to be taken.

## 2017-08-21 NOTE — ED Provider Notes (Signed)
[facility] EMERGENCY DEPARTMENT Provider Note   CSN: [id number] Arrival date & time: 08/21/17  1809     History   Chief Complaint Chief Complaint  Patient presents with  . Back Pain    HPI Gabriel Gibbs is a 67 y.o. male.  HPI Gabriel Gibbs is a 67 y.o. male with history of COPD, chronic neck and back pain and pain management, presents to emergency department after a back injury.  Patient states that he was lifting something heavy on his tractor-trailer when he suddenly felt severe sharp pain in his lower back.  He states he felt a pop.  He states pain radiates into bilateral thighs, but does not radiate past his thighs.  He denies any trouble controlling his bladder or bowels.  This accident happened earlier this morning.  He states he is having pain with any movement.  He is also reporting some shortness of breath and chest pain that started 2 days ago.  Patient states he had to take nitroglycerin for this pain and felt very dizzy.  He states eventually pain improved.  He states he is very concerned about that as well.  He takes oxycodone 10 mg and Soma for his chronic pain and has not had any medicine since this morning.  Past Medical History:  Diagnosis Date  . Headache(784.0)   . Hx of bronchitis   . Psoriasis   . Shortness of breath     There are no active problems to display for this patient.   Past Surgical History:  Procedure Laterality Date  . ANTERIOR CERVICAL DECOMP/DISCECTOMY FUSION N/A 05/24/2013   Procedure: CERVICAL FOUR-FIVE ANTERIOR CERVICAL DISCECTOMY FUSION;  Surgeon: Mariam Dollar, MD;  Location: [facility];  Service: Neurosurgery;  Laterality: N/A;  . LEG SURGERY     right  . LIVER SURGERY     part of liver removed  . LUMBAR SPINE SURGERY  1980  . SPLENECTOMY, TOTAL         Home Medications    Prior to Admission medications   Medication Sig Start Date End Date Taking? Authorizing Provider  albuterol (PROVENTIL  HFA;VENTOLIN HFA) 108 (90 BASE) MCG/ACT inhaler Inhale 2 puffs into the lungs 4 (four) times daily as needed for wheezing.    [provider]  carisoprodol (SOMA) 350 MG tablet Take 350 mg by mouth 3 (three) times daily as needed for muscle spasms.    [provider]  cyclobenzaprine (FLEXERIL) 10 MG tablet Take 1 tablet (10 mg total) by mouth 3 (three) times daily as needed for muscle spasms. 05/26/13   Donalee Citrin, MD  oxyCODONE (ROXICODONE) 5 MG immediate release tablet Take 3 tablets (15 mg total) by mouth every 4 (four) hours as needed for pain. 05/26/13   Donalee Citrin, MD  oxyCODONE-acetaminophen (PERCOCET) 10-325 MG per tablet Take 1 tablet by mouth 3 (three) times daily as needed for pain.    [provider]  triamcinolone ointment (KENALOG) 0.5 % Apply 1 application topically as needed. Applies to buttock & elbows    [provider]    Family History History reviewed. No pertinent family history.  Social History Social History   Tobacco Use  . Smoking status: Current Every Day Smoker    Packs/day: 2.00    Years: 45.00    Pack years: 90.00    Types: Cigarettes  . Smokeless tobacco: Never Used  Substance Use Topics  . Alcohol use: No    Frequency: Never  .  Drug use: No     Allergies   Patient has no known allergies.   Review of Systems Review of Systems  Constitutional: Negative for chills and fever.  Respiratory: Positive for chest tightness and shortness of breath. Negative for cough.   Cardiovascular: Negative for chest pain, palpitations and leg swelling.  Gastrointestinal: Negative for abdominal distention, abdominal pain, diarrhea, nausea and vomiting.  Genitourinary: Negative for dysuria, frequency, hematuria and urgency.  Musculoskeletal: Positive for arthralgias, back pain and myalgias. Negative for neck pain and neck stiffness.  Skin: Negative for rash.  Allergic/Immunologic: Negative for immunocompromised state.    Neurological: Positive for dizziness and light-headedness. Negative for weakness, numbness and headaches.  All other systems reviewed and are negative.    Physical Exam Updated Vital Signs BP (!) 145/74   Pulse (!) 112   Temp 98.8 F (37.1 C) (Oral)   Resp (!) 24   Ht 5\' 9"  (1.753 m)   Wt 59.9 kg (132 lb)   SpO2 93%   BMI 19.49 kg/m   Physical Exam  Constitutional: He appears well-developed and well-nourished. No distress.  HENT:  Head: Normocephalic and atraumatic.  Eyes: Conjunctivae are normal.  Neck: Neck supple.  Cardiovascular: Regular rhythm and normal heart sounds.  tachycardic  Pulmonary/Chest: Effort normal. No respiratory distress. He has no wheezes. He has no rales.  Abdominal: Soft. Bowel sounds are normal. He exhibits no distension. There is no tenderness. There is no rebound.  Musculoskeletal: He exhibits no edema.  Midline and bilateral paraspinal tenderness over lower lumbar spine, with no obvious deformity, swelling, bruising.  Pain with right straight leg raise.  Neurological: He is alert.  5/5 and equal lower extremity strength. 2+ and equal patellar reflexes bilaterally. Pt able to dorsiflex bilateral toes and feet with good strength against resistance. Equal sensation bilaterally over thighs and lower legs.   Skin: Skin is warm and dry.  Nursing note and vitals reviewed.    ED Treatments / Results  Labs (all labs ordered are listed, but only abnormal results are displayed) Labs Reviewed  BASIC METABOLIC PANEL - Abnormal; Notable for the following components:      Result Value   Chloride 99 (*)    Glucose, Bld 152 (*)    BUN 21 (*)    All other components within normal limits  CBC - Abnormal; Notable for the following components:   WBC 17.0 (*)    All other components within normal limits  URINALYSIS, ROUTINE W REFLEX MICROSCOPIC  I-STAT TROPONIN, ED    EKG  EKG Interpretation None       Radiology No results  found.  Procedures Procedures (including critical care time)  Medications Ordered in ED Medications  HYDROmorphone (DILAUDID) injection 1 mg (1 mg Intravenous Given 08/21/17 1924)  ondansetron (ZOFRAN) injection 4 mg (4 mg Intravenous Given 08/21/17 1924)  sodium chloride 0.9 % bolus 1,000 mL (1,000 mLs Intravenous New Bag/Given 08/21/17 1925)     Initial Impression / Assessment and Plan / ED Course  I have reviewed the triage vital signs and the nursing notes.  Pertinent labs & imaging results that were available during my care of the patient were reviewed by me and considered in my medical decision making (see chart for details).    Patient with lower back injury while lifting a heavy object earlier this morning.  He appears to be in a lot of pain.  He is neurovascularly intact however has difficulty with any movement.  Will treat his pain  with Dilaudid.  Patient was found to have elevated heart rate in 120s upon arrival.  Sinus rhythm on the EKG.  Patient complained of some chest discomfort 2 days ago with dizziness.  Will check blood work and troponin.  Will get x-rays of the chest and lumbar spine.  9:32 PM Pt states pain improved, now down to 5/10, however, HR and BP not improving. Still appears SOB. Will get CT angio chest to RO PE. Will get lumbar CT for further evaluation of possible compression fracture  11:02 PM Spoke with neurosurgery, Cindra PresumeVincent Costella, PAC,  regarding L3 fracture on CT. Advised to place in TLSO brace and follow up in the office. Pt's ct angio is negative. HR still elevated, will try another bolus of fluids. Will add steroids, UA.   12:59 AM Pt continues to be hypoxic, oxygen sat in the low 90s at rest, and it drops into the mid 80s when he starts moving around.  Patient's pain did improve, he was able to get out of bed to go to the bathroom.  I instructed him not to get up out of the bed anymore without his TLSO brace which is still not here, I did put an order  for it.  I spoke with hospitalist, they will admit patient.  Vitals:   08/21/17 1930 08/21/17 2052 08/21/17 2100 08/21/17 2300  BP: 137/64 118/81 139/63 140/71  Pulse: (!) 107 (!) 118 (!) 114 (!) 105  Resp: 20 (!) 23 19 14   Temp:      TempSrc:      SpO2: (!) 89%  93% 94%  Weight:      Height:             Final Clinical Impressions(s) / ED Diagnoses   Final diagnoses:  COPD exacerbation (HCC)  Hypoxia  Closed compression fracture of third lumbar vertebra, initial encounter [facility](HCC)  Tachycardia    ED Discharge Orders    None       Jaynie CrumbleKirichenko, Stevee Valenta, PA-C 08/22/17 0113    Alvira MondaySchlossman, Erin, MD 08/22/17 [phone]

## 2017-08-21 NOTE — ED Notes (Signed)
Pt's SpO2 noted to be 86% on RA.  Patient wears 2L [place] when sleeping, day or night.

## 2017-08-22 ENCOUNTER — Encounter (HOSPITAL_COMMUNITY): Payer: Self-pay | Admitting: Internal Medicine

## 2017-08-22 ENCOUNTER — Observation Stay (HOSPITAL_BASED_OUTPATIENT_CLINIC_OR_DEPARTMENT_OTHER): Payer: Medicare HMO

## 2017-08-22 DIAGNOSIS — J441 Chronic obstructive pulmonary disease with (acute) exacerbation: Secondary | ICD-10-CM | POA: Diagnosis present

## 2017-08-22 DIAGNOSIS — E872 Acidosis: Secondary | ICD-10-CM | POA: Diagnosis present

## 2017-08-22 DIAGNOSIS — A419 Sepsis, unspecified organism: Secondary | ICD-10-CM | POA: Diagnosis not present

## 2017-08-22 DIAGNOSIS — I110 Hypertensive heart disease with heart failure: Secondary | ICD-10-CM | POA: Diagnosis present

## 2017-08-22 DIAGNOSIS — I10 Essential (primary) hypertension: Secondary | ICD-10-CM | POA: Diagnosis present

## 2017-08-22 DIAGNOSIS — I2510 Atherosclerotic heart disease of native coronary artery without angina pectoris: Secondary | ICD-10-CM | POA: Diagnosis present

## 2017-08-22 DIAGNOSIS — Z9081 Acquired absence of spleen: Secondary | ICD-10-CM | POA: Diagnosis not present

## 2017-08-22 DIAGNOSIS — M4856XA Collapsed vertebra, not elsewhere classified, lumbar region, initial encounter for fracture: Secondary | ICD-10-CM | POA: Diagnosis present

## 2017-08-22 DIAGNOSIS — Z720 Tobacco use: Secondary | ICD-10-CM | POA: Diagnosis present

## 2017-08-22 DIAGNOSIS — Z79899 Other long term (current) drug therapy: Secondary | ICD-10-CM | POA: Diagnosis not present

## 2017-08-22 DIAGNOSIS — J9621 Acute and chronic respiratory failure with hypoxia: Principal | ICD-10-CM | POA: Diagnosis present

## 2017-08-22 DIAGNOSIS — R079 Chest pain, unspecified: Secondary | ICD-10-CM | POA: Diagnosis not present

## 2017-08-22 DIAGNOSIS — M549 Dorsalgia, unspecified: Secondary | ICD-10-CM | POA: Diagnosis present

## 2017-08-22 DIAGNOSIS — M545 Low back pain: Secondary | ICD-10-CM | POA: Diagnosis present

## 2017-08-22 DIAGNOSIS — R000 Tachycardia, unspecified: Secondary | ICD-10-CM | POA: Diagnosis not present

## 2017-08-22 DIAGNOSIS — F17210 Nicotine dependence, cigarettes, uncomplicated: Secondary | ICD-10-CM | POA: Diagnosis present

## 2017-08-22 DIAGNOSIS — J069 Acute upper respiratory infection, unspecified: Secondary | ICD-10-CM | POA: Diagnosis present

## 2017-08-22 DIAGNOSIS — I5022 Chronic systolic (congestive) heart failure: Secondary | ICD-10-CM | POA: Diagnosis present

## 2017-08-22 DIAGNOSIS — Z981 Arthrodesis status: Secondary | ICD-10-CM | POA: Diagnosis not present

## 2017-08-22 DIAGNOSIS — E785 Hyperlipidemia, unspecified: Secondary | ICD-10-CM | POA: Diagnosis present

## 2017-08-22 DIAGNOSIS — Z955 Presence of coronary angioplasty implant and graft: Secondary | ICD-10-CM | POA: Diagnosis not present

## 2017-08-22 DIAGNOSIS — Z7902 Long term (current) use of antithrombotics/antiplatelets: Secondary | ICD-10-CM | POA: Diagnosis not present

## 2017-08-22 DIAGNOSIS — S32030A Wedge compression fracture of third lumbar vertebra, initial encounter for closed fracture: Secondary | ICD-10-CM | POA: Diagnosis not present

## 2017-08-22 DIAGNOSIS — K219 Gastro-esophageal reflux disease without esophagitis: Secondary | ICD-10-CM | POA: Diagnosis present

## 2017-08-22 DIAGNOSIS — G8929 Other chronic pain: Secondary | ICD-10-CM | POA: Diagnosis present

## 2017-08-22 DIAGNOSIS — R6510 Systemic inflammatory response syndrome (SIRS) of non-infectious origin without acute organ dysfunction: Secondary | ICD-10-CM | POA: Diagnosis present

## 2017-08-22 DIAGNOSIS — L409 Psoriasis, unspecified: Secondary | ICD-10-CM | POA: Diagnosis present

## 2017-08-22 DIAGNOSIS — R0902 Hypoxemia: Secondary | ICD-10-CM | POA: Diagnosis not present

## 2017-08-22 DIAGNOSIS — I472 Ventricular tachycardia: Secondary | ICD-10-CM | POA: Diagnosis present

## 2017-08-22 DIAGNOSIS — Z7982 Long term (current) use of aspirin: Secondary | ICD-10-CM | POA: Diagnosis not present

## 2017-08-22 LAB — HEMOGLOBIN A1C
HEMOGLOBIN A1C: 6.5 % — AB (ref 4.8–5.6)
MEAN PLASMA GLUCOSE: 139.85 mg/dL

## 2017-08-22 LAB — INFLUENZA PANEL BY PCR (TYPE A & B)
Influenza A By PCR: NEGATIVE
Influenza B By PCR: NEGATIVE

## 2017-08-22 LAB — LIPID PANEL
CHOL/HDL RATIO: 2.8 ratio
Cholesterol: 165 mg/dL (ref 0–200)
HDL: 58 mg/dL (ref >40)
LDL CALC: 95 mg/dL (ref 0–99)
TRIGLYCERIDES: 61 mg/dL (ref <150)
VLDL: 12 mg/dL (ref 0–40)

## 2017-08-22 LAB — HIV ANTIBODY (ROUTINE TESTING W REFLEX): HIV SCREEN 4TH GENERATION: NONREACTIVE

## 2017-08-22 LAB — RESPIRATORY PANEL BY PCR
ADENOVIRUS-RVPPCR: NOT DETECTED
BORDETELLA PERTUSSIS-RVPCR: NOT DETECTED
CORONAVIRUS 229E-RVPPCR: NOT DETECTED
CORONAVIRUS HKU1-RVPPCR: NOT DETECTED
CORONAVIRUS NL63-RVPPCR: NOT DETECTED
Chlamydophila pneumoniae: NOT DETECTED
Coronavirus OC43: NOT DETECTED
Influenza A: NOT DETECTED
Influenza B: NOT DETECTED
METAPNEUMOVIRUS-RVPPCR: NOT DETECTED
Mycoplasma pneumoniae: NOT DETECTED
Parainfluenza Virus 1: NOT DETECTED
Parainfluenza Virus 2: NOT DETECTED
Parainfluenza Virus 3: NOT DETECTED
Parainfluenza Virus 4: NOT DETECTED
Respiratory Syncytial Virus: NOT DETECTED
Rhinovirus / Enterovirus: NOT DETECTED

## 2017-08-22 LAB — STREP PNEUMONIAE URINARY ANTIGEN: STREP PNEUMO URINARY ANTIGEN: NEGATIVE

## 2017-08-22 LAB — URINALYSIS, ROUTINE W REFLEX MICROSCOPIC
Bilirubin Urine: NEGATIVE
GLUCOSE, UA: NEGATIVE mg/dL
HGB URINE DIPSTICK: NEGATIVE
Ketones, ur: NEGATIVE mg/dL
Leukocytes, UA: NEGATIVE
Nitrite: NEGATIVE
PH: 5 (ref 5.0–8.0)
Protein, ur: NEGATIVE mg/dL

## 2017-08-22 LAB — LACTIC ACID, PLASMA
LACTIC ACID, VENOUS: 2 mmol/L — AB (ref 0.5–1.9)
Lactic Acid, Venous: 3.8 mmol/L (ref 0.5–1.9)

## 2017-08-22 LAB — ECHOCARDIOGRAM COMPLETE
HEIGHTINCHES: 69 in
Weight: 2112 oz

## 2017-08-22 LAB — PROCALCITONIN: Procalcitonin: <0.1 ng/mL

## 2017-08-22 LAB — TROPONIN I
Troponin I: <0.03 ng/mL (ref <0.03)
Troponin I: <0.03 ng/mL (ref <0.03)
Troponin I: <0.03 ng/mL (ref <0.03)

## 2017-08-22 MED ORDER — ONDANSETRON HCL 4 MG/2ML IJ SOLN: 4.0000 mg | Freq: Three times a day (TID) | INTRAMUSCULAR | Status: DC | PRN

## 2017-08-22 MED ORDER — IPRATROPIUM BROMIDE 0.02 % IN SOLN
0.5000 mg | RESPIRATORY_TRACT | Status: DC
  Administered 2017-08-22: 0.5 mg via RESPIRATORY_TRACT
  Filled 2017-08-22: qty 2.5

## 2017-08-22 MED ORDER — CYCLOBENZAPRINE HCL 10 MG PO TABS: 10.0000 mg | ORAL_TABLET | Freq: Three times a day (TID) | ORAL | Status: DC | PRN

## 2017-08-22 MED ORDER — NITROGLYCERIN 0.4 MG SL SUBL
0.4000 mg | SUBLINGUAL_TABLET | SUBLINGUAL | Status: DC | PRN
Start: 2017-08-22 — End: 2017-08-24

## 2017-08-22 MED ORDER — PRAVASTATIN SODIUM 20 MG PO TABS
20.0000 mg | ORAL_TABLET | Freq: Every evening | ORAL | Status: DC
  Administered 2017-08-22 – 2017-08-23 (×2): 20 mg via ORAL
  Filled 2017-08-22 (×2): qty 1

## 2017-08-22 MED ORDER — CARISOPRODOL 350 MG PO TABS
350.0000 mg | ORAL_TABLET | Freq: Three times a day (TID) | ORAL | Status: DC | PRN
  Administered 2017-08-23 (×2): 350 mg via ORAL
  Filled 2017-08-22 (×2): qty 1

## 2017-08-22 MED ORDER — METHYLPREDNISOLONE SODIUM SUCC 125 MG IJ SOLR
125.0000 mg | Freq: Two times a day (BID) | INTRAMUSCULAR | Status: DC
  Administered 2017-08-22 – 2017-08-23 (×2): 125 mg via INTRAVENOUS
  Filled 2017-08-22 (×3): qty 2

## 2017-08-22 MED ORDER — OXYCODONE-ACETAMINOPHEN 5-325 MG PO TABS
1.0000 | ORAL_TABLET | Freq: Four times a day (QID) | ORAL | Status: DC | PRN
  Administered 2017-08-22 – 2017-08-24 (×3): 1 via ORAL
  Filled 2017-08-22 (×3): qty 1

## 2017-08-22 MED ORDER — ASPIRIN EC 81 MG PO TBEC
81.0000 mg | DELAYED_RELEASE_TABLET | Freq: Every day | ORAL | Status: DC
  Administered 2017-08-22 – 2017-08-24 (×3): 81 mg via ORAL
  Filled 2017-08-22 (×3): qty 1

## 2017-08-22 MED ORDER — LEVALBUTEROL HCL 1.25 MG/0.5ML IN NEBU
1.2500 mg | INHALATION_SOLUTION | Freq: Four times a day (QID) | RESPIRATORY_TRACT | Status: DC
  Administered 2017-08-22: 1.25 mg via RESPIRATORY_TRACT
  Filled 2017-08-22: qty 0.5

## 2017-08-22 MED ORDER — CLOPIDOGREL BISULFATE 75 MG PO TABS
75.0000 mg | ORAL_TABLET | Freq: Every day | ORAL | Status: DC
  Administered 2017-08-22 – 2017-08-24 (×3): 75 mg via ORAL
  Filled 2017-08-22 (×3): qty 1

## 2017-08-22 MED ORDER — LEVALBUTEROL HCL 1.25 MG/0.5ML IN NEBU
1.2500 mg | INHALATION_SOLUTION | Freq: Three times a day (TID) | RESPIRATORY_TRACT | Status: DC
  Administered 2017-08-22 – 2017-08-23 (×2): 1.25 mg via RESPIRATORY_TRACT
  Filled 2017-08-22 (×3): qty 0.5

## 2017-08-22 MED ORDER — LEVALBUTEROL HCL 1.25 MG/0.5ML IN NEBU: 1.2500 mg | INHALATION_SOLUTION | Freq: Four times a day (QID) | RESPIRATORY_TRACT | Status: DC

## 2017-08-22 MED ORDER — CARVEDILOL 6.25 MG PO TABS
6.2500 mg | ORAL_TABLET | Freq: Two times a day (BID) | ORAL | Status: DC
  Administered 2017-08-22 – 2017-08-24 (×6): 6.25 mg via ORAL
  Filled 2017-08-22 (×6): qty 1

## 2017-08-22 MED ORDER — IPRATROPIUM-ALBUTEROL 0.5-2.5 (3) MG/3ML IN SOLN
3.0000 mL | Freq: Once | RESPIRATORY_TRACT | Status: AC
Start: 2017-08-22 — End: 2017-08-22
  Administered 2017-08-22: 3 mL via RESPIRATORY_TRACT
  Filled 2017-08-22: qty 3

## 2017-08-22 MED ORDER — NICOTINE 21 MG/24HR TD PT24
21.0000 mg | MEDICATED_PATCH | Freq: Every day | TRANSDERMAL | Status: DC
  Filled 2017-08-22 (×3): qty 1

## 2017-08-22 MED ORDER — MORPHINE SULFATE (PF) 4 MG/ML IV SOLN
1.0000 mg | INTRAVENOUS | Status: DC | PRN
  Administered 2017-08-22 (×2): 1 mg via INTRAVENOUS
  Filled 2017-08-22 (×2): qty 1

## 2017-08-22 MED ORDER — DM-GUAIFENESIN ER 30-600 MG PO TB12: 1.0000 | ORAL_TABLET | Freq: Two times a day (BID) | ORAL | Status: DC | PRN

## 2017-08-22 MED ORDER — SPIRONOLACTONE 25 MG PO TABS
25.0000 mg | ORAL_TABLET | Freq: Every day | ORAL | Status: DC
  Administered 2017-08-22 – 2017-08-23 (×2): 25 mg via ORAL
  Filled 2017-08-22 (×2): qty 1

## 2017-08-22 MED ORDER — IPRATROPIUM-ALBUTEROL 0.5-2.5 (3) MG/3ML IN SOLN
3.0000 mL | Freq: Once | RESPIRATORY_TRACT | Status: AC
  Administered 2017-08-22: 3 mL via RESPIRATORY_TRACT

## 2017-08-22 MED ORDER — AZITHROMYCIN 500 MG PO TABS
250.0000 mg | ORAL_TABLET | Freq: Every day | ORAL | Status: DC
  Administered 2017-08-23 – 2017-08-24 (×2): 250 mg via ORAL
  Filled 2017-08-22 (×3): qty 1

## 2017-08-22 MED ORDER — IPRATROPIUM BROMIDE 0.02 % IN SOLN
0.5000 mg | Freq: Three times a day (TID) | RESPIRATORY_TRACT | Status: DC
  Administered 2017-08-22 – 2017-08-23 (×2): 0.5 mg via RESPIRATORY_TRACT
  Filled 2017-08-22 (×3): qty 2.5

## 2017-08-22 MED ORDER — ORAL CARE MOUTH RINSE
15.0000 mL | Freq: Two times a day (BID) | OROMUCOSAL | Status: DC
  Administered 2017-08-23 – 2017-08-24 (×3): 15 mL via OROMUCOSAL

## 2017-08-22 MED ORDER — OXYCODONE HCL 5 MG PO TABS
5.0000 mg | ORAL_TABLET | Freq: Four times a day (QID) | ORAL | Status: DC | PRN
  Administered 2017-08-22 – 2017-08-24 (×2): 5 mg via ORAL
  Filled 2017-08-22 (×2): qty 1

## 2017-08-22 MED ORDER — OXYCODONE-ACETAMINOPHEN 10-325 MG PO TABS: 1.0000 | ORAL_TABLET | Freq: Four times a day (QID) | ORAL | Status: DC | PRN

## 2017-08-22 MED ORDER — HYDROMORPHONE HCL 1 MG/ML IJ SOLN
0.5000 mg | INTRAMUSCULAR | Status: DC | PRN
  Administered 2017-08-22 – 2017-08-24 (×7): 0.5 mg via INTRAVENOUS
  Filled 2017-08-22 (×7): qty 1

## 2017-08-22 MED ORDER — AZITHROMYCIN 500 MG PO TABS
500.0000 mg | ORAL_TABLET | Freq: Every day | ORAL | Status: AC
  Administered 2017-08-22: 500 mg via ORAL
  Filled 2017-08-22: qty 1

## 2017-08-22 MED ORDER — IPRATROPIUM BROMIDE 0.02 % IN SOLN
0.5000 mg | Freq: Four times a day (QID) | RESPIRATORY_TRACT | Status: DC
  Administered 2017-08-22: 0.5 mg via RESPIRATORY_TRACT
  Filled 2017-08-22: qty 2.5

## 2017-08-22 MED ORDER — LISINOPRIL 20 MG PO TABS
20.0000 mg | ORAL_TABLET | Freq: Every day | ORAL | Status: DC
  Administered 2017-08-22 – 2017-08-24 (×3): 20 mg via ORAL
  Filled 2017-08-22 (×3): qty 1

## 2017-08-22 MED ORDER — PANTOPRAZOLE SODIUM 40 MG PO TBEC
40.0000 mg | DELAYED_RELEASE_TABLET | Freq: Every day | ORAL | Status: DC
  Administered 2017-08-22 – 2017-08-24 (×3): 40 mg via ORAL
  Filled 2017-08-22 (×3): qty 1

## 2017-08-22 MED ORDER — ENOXAPARIN SODIUM 40 MG/0.4ML ~~LOC~~ SOLN
40.0000 mg | SUBCUTANEOUS | Status: DC
  Administered 2017-08-22 – 2017-08-24 (×3): 40 mg via SUBCUTANEOUS
  Filled 2017-08-22 (×3): qty 0.4

## 2017-08-22 NOTE — H&P (Signed)
History and Physical    Gabriel Gibbs ZOX:[id number] DOB: 01/23/1951 DOA: 08/21/2017  Referring MD/NP/PA:   PCP: Quitman Livings, MD   Patient coming from:  The patient is coming from home.  At baseline, pt is independent for most of ADL.   Chief Complaint: back pain, SOB, cough and CP  HPI: Gabriel Gibbs is a 67 y.o. male with medical history significant of hypertension, hyperlipidemia, tobacco abuse, possible COPD, on 2 L nasal cannula oxygen, GERD, psoriasis, tobacco abuse, chronic back pain and neck pain, CAD, s/p of stent placement, sCHF (EF 30% by L cardiac cath on 01/26/17), who presents with back pain, shortness of breath, cough and chest pain.  Patient states that he was lifting something heavy on his tractor-trailer when he suddenly felt severe sharp pain in his lower back this AM.  He states he felt a pop. The pain is constant, sharp, severe, radiating to both thighs, tingliness in both thighs, no loss of control of bowel movement or bladder. It is aggravated by movement. He also reports cough and shortness breath which has been going on for 2 days. He coughs up white mucus, no fever or chills. Denies runny nose or sore throat. Patient states that he has chest pain, which is located in the upper chest, constant, dull, 2 out of 10 in severity, nonradiating. Denies symptoms of UTI or unilateral weakness.  ED Course: pt was found to have WBC 17, electrolytes renal function okay, temperature normal, tachycardia, tachypnea, oxygen saturation 89% on room air, negative chest x-ray. CTA of chest is negative for PE, but showed severe emphysematous change. Patient is placed on telemetry bed for observation. Neurosurgeon, Dr. Marilynn Rail was consulted.  # CT of L-spine showed: Acute L3 compression fracture with mild retropulsion (approximately 5.5 mm) and mild spinal canal stenosis.  Review of Systems:   General: no fevers, chills, no body weight gain, has fatigue HEENT: no blurry vision,  hearing changes or sore throat Respiratory: has dyspnea, coughing, no wheezing CV: has chest pain, no palpitations GI: no nausea, vomiting, abdominal pain, diarrhea, constipation GU: no dysuria, burning on urination, increased urinary frequency, hematuria  Ext: no leg edema Neuro: no unilateral weakness, numbness, or tingling, no vision change or hearing loss Skin: no rash, no skin tear. MSK: has back pain and neck pain. Heme: No easy bruising.  Travel history: No recent long distant travel.  Allergy: No Known Allergies  Past Medical History:  Diagnosis Date  . CAD (coronary artery disease)   . Essential hypertension   . GERD (gastroesophageal reflux disease)   . Headache(784.0)   . HLD (hyperlipidemia)   . Hx of bronchitis   . Psoriasis   . Shortness of breath   . Tobacco abuse     Past Surgical History:  Procedure Laterality Date  . ANTERIOR CERVICAL DECOMP/DISCECTOMY FUSION N/A 05/24/2013   Procedure: CERVICAL FOUR-FIVE ANTERIOR CERVICAL DISCECTOMY FUSION;  Surgeon: Mariam Dollar, MD;  Location: [facility];  Service: Neurosurgery;  Laterality: N/A;  . LEG SURGERY     right  . LIVER SURGERY     part of liver removed  . LUMBAR SPINE SURGERY  1980  . SPLENECTOMY, TOTAL      Social History:  reports that he has been smoking cigarettes.  He has a 90.00 pack-year smoking history. he has never used smokeless tobacco. He reports that he does not drink alcohol or use drugs.  Family History:  Family History  Problem Relation Age of Onset  .  Arthritis Mother   . Diabetes Mellitus II Father   . Heart disease Father      Prior to Admission medications   Medication Sig Start Date End Date Taking? Authorizing Provider  albuterol (ACCUNEB) 1.25 MG/3ML nebulizer solution Take 2 ampules by nebulization daily.    Yes [provider]  albuterol (PROVENTIL HFA;VENTOLIN HFA) 108 (90 BASE) MCG/ACT inhaler Inhale 2 puffs into the lungs 4 (four) times daily as needed for  wheezing.   Yes [provider]  aspirin EC 81 MG tablet Take 81 mg by mouth daily.   Yes [provider]  carisoprodol (SOMA) 350 MG tablet Take 350 mg by mouth 3 (three) times daily as needed for muscle spasms.   Yes [provider]  carvedilol (COREG) 6.25 MG tablet Take 6.25 mg by mouth 2 (two) times daily. 06/23/17  Yes [provider]  clopidogrel (PLAVIX) 75 MG tablet Take 75 mg by mouth daily. 07/29/17  Yes [provider]  lisinopril (PRINIVIL,ZESTRIL) 20 MG tablet Take 20 mg by mouth daily. 06/04/17  Yes [provider]  oxyCODONE-acetaminophen (PERCOCET) 10-325 MG per tablet Take 1 tablet by mouth 3 (three) times daily as needed for pain.   Yes [provider]  pantoprazole (PROTONIX) 40 MG tablet Take 40 mg by mouth daily. 07/29/17  Yes [provider]  pravastatin (PRAVACHOL) 20 MG tablet Take 20 mg by mouth every evening.  07/29/17  Yes [provider]  spironolactone (ALDACTONE) 25 MG tablet Take 25 mg by mouth daily. 07/29/17  Yes [provider]  cyclobenzaprine (FLEXERIL) 10 MG tablet Take 1 tablet (10 mg total) by mouth 3 (three) times daily as needed for muscle spasms. Patient not taking: Reported on 08/21/2017 05/26/13   Donalee Citrinram, Gary, MD  oxyCODONE (ROXICODONE) 5 MG immediate release tablet Take 3 tablets (15 mg total) by mouth every 4 (four) hours as needed for pain. Patient not taking: Reported on 08/21/2017 05/26/13   Donalee Citrinram, Gary, MD    Physical Exam: Vitals:   08/21/17 2052 08/21/17 2100 08/21/17 2300 08/22/17 0030  BP: 118/81 139/63 140/71 134/82  Pulse: (!) 118 (!) 114 (!) 105 (!) 121  Resp: (!) 23 19 14 16   Temp:      TempSrc:      SpO2:  93% 94% 91%  Weight:      Height:       General: Not in acute distress HEENT:       Eyes: PERRL, EOMI, no scleral icterus.       ENT: No discharge from the ears and nose, no pharynx injection, no tonsillar enlargement.        Neck: No JVD,  no bruit, no mass felt. Heme: No neck lymph node enlargement. Cardiac: S1/S2, RRR, No murmurs, No gallops or rubs. Respiratory: Has diffuse rhonchi bilaterally. GI: Soft, nondistended, nontender, no rebound pain, no organomegaly, BS present. GU: No hematuria Ext: No pitting leg edema bilaterally. 2+DP/PT pulse bilaterally. Musculoskeletal: has lower back tenderness Skin: No rashes.  Neuro: Alert, oriented X3, cranial nerves II-XII grossly intact, moves all extremities normally Psych: Patient is not psychotic, no suicidal or hemocidal ideation.  Labs on Admission: I have personally reviewed following labs and imaging studies  CBC: Recent Labs  Lab 08/21/17 1838  WBC 17.0*  HGB 15.1  HCT 45.3  MCV 97.4  PLT 331   Basic Metabolic Panel: Recent Labs  Lab 08/21/17 1838  NA 138  K 4.2  CL 99*  CO2 27  GLUCOSE 152*  BUN 21*  CREATININE 0.83  CALCIUM 9.2   GFR: Estimated Creatinine Clearance: 74.2 mL/min (by C-G formula based on SCr of 0.83 mg/dL). Liver Function Tests: No results for input(s): AST, ALT, ALKPHOS, BILITOT, PROT, ALBUMIN in the last 168 hours. No results for input(s): LIPASE, AMYLASE in the last 168 hours. No results for input(s): AMMONIA in the last 168 hours. Coagulation Profile: No results for input(s): INR, PROTIME in the last 168 hours. Cardiac Enzymes: No results for input(s): CKTOTAL, CKMB, CKMBINDEX, TROPONINI in the last 168 hours. BNP (last 3 results) No results for input(s): PROBNP in the last 8760 hours. HbA1C: No results for input(s): HGBA1C in the last 72 hours. CBG: No results for input(s): GLUCAP in the last 168 hours. Lipid Profile: No results for input(s): CHOL, HDL, LDLCALC, TRIG, CHOLHDL, LDLDIRECT in the last 72 hours. Thyroid Function Tests: No results for input(s): TSH, T4TOTAL, FREET4, T3FREE, THYROIDAB in the last 72 hours. Anemia Panel: No results for input(s): VITAMINB12, FOLATE, FERRITIN, TIBC, IRON, RETICCTPCT in the last  72 hours. Urine analysis: No results found for: COLORURINE, APPEARANCEUR, LABSPEC, PHURINE, GLUCOSEU, HGBUR, BILIRUBINUR, KETONESUR, PROTEINUR, UROBILINOGEN, NITRITE, LEUKOCYTESUR Sepsis Labs: @LABRCNTIP (procalcitonin:4,lacticidven:4) )No results found for this or any previous visit (from the past 240 hour(s)).   Radiological Exams on Admission: Dg Chest 2 View  Result Date: 08/21/2017 CLINICAL DATA:  Center and right lumbar pain after lifting heavy break drum. EXAM: CHEST  2 VIEW COMPARISON:  January 23, 2017 FINDINGS: The mediastinal contour is normal. The heart size is mildly enlarged. The lungs are hyperinflated. Mild atelectasis of left lung base is noted. There is no focal pneumonia or pleural effusion. The visualized skeletal structures are unremarkable. IMPRESSION: No active cardiopulmonary disease.  Emphysema. Electronically Signed   By: Sherian Rein M.D.   On: 08/21/2017 20:12   Dg Lumbar Spine Complete  Result Date: 08/21/2017 CLINICAL DATA:  Back pain after lifting heavy break drum. EXAM: LUMBAR SPINE - COMPLETE 4+ VIEW COMPARISON:  Abdomen x-ray November 14, 2013 CT abdomen and pelvis November 12, 2013 FINDINGS: There is compression deformity of L3 vertebral body with question mild cortical irregularity of the superior endplate, acute compression fracture is not excluded. There is no malalignment. Degenerative joint changes of the spine are noted. IMPRESSION: Mild compression deformity of L3 vertebral body with question mild cortical irregularity of the superior endplate, acute compression fracture is not excluded. This finding is new compared to prior films from 2015. Electronically Signed   By: Sherian Rein M.D.   On: 08/21/2017 20:14   Ct Angio Chest Pe W And/or Wo Contrast  Result Date: 08/21/2017 CLINICAL DATA:  Back injury after lifting heavy object today. History of bronchitis and psoriasis. EXAM: CT ANGIOGRAPHY CHEST WITH CONTRAST TECHNIQUE: Multidetector CT imaging of the chest was  performed using the standard protocol during bolus administration of intravenous contrast. Multiplanar CT image reconstructions and MIPs were obtained to evaluate the vascular anatomy. CONTRAST:  47 cc ISOVUE-370 IOPAMIDOL (ISOVUE-370) INJECTION 76% COMPARISON:  Chest radiograph August 21, 2017 and CT chest August 21, 2016 FINDINGS: CARDIOVASCULAR: Adequate contrast opacification of the pulmonary artery's. Main pulmonary artery is not enlarged. No pulmonary arterial filling defects to the level of the subsegmental branches. Heart size is mildly enlarged, no right heart strain. Trace coronary artery calcifications. Mild pericardial wall thickening. Thoracic aorta is normal course and caliber, mild calcific atherosclerosis aortic arch. MEDIASTINUM/NODES: No lymphadenopathy by CT size criteria. LUNGS/PLEURA: Tracheobronchial tree is patent, no pneumothorax. Mild bronchial  wall thickening. Marked biapical bullous changes with severe centrilobular emphysema. Bibasilar dependent atelectasis. UPPER ABDOMEN: Status post splenectomy with splenosis. Multiple surgical clips in upper abdomen. Colonic intra positioning. MUSCULOSKELETAL: Old moderate to severe T7 compression fracture with 50-75% height loss. Osteopenia. Multilevel Schmorl's nodes. Review of the MIP images confirms the above findings. IMPRESSION: 1. No acute pulmonary embolism. 2. Severe emphysema. Bronchial wall thickening seen with reactive airway disease and bronchitis. No pneumonia. Aortic Atherosclerosis (ICD10-I70.0) and Emphysema (ICD10-J43.9). Electronically Signed   By: Awilda Metro M.D.   On: 08/21/2017 22:29   Ct Lumbar Spine Wo Contrast  Result Date: 08/21/2017 CLINICAL DATA:  Lifting injury. Back pain. Abnormal lumbar spine radiographs showing and L3 compression fracture. EXAM: CT LUMBAR SPINE WITHOUT CONTRAST TECHNIQUE: Multidetector CT imaging of the lumbar spine was performed without intravenous contrast administration. Multiplanar CT  image reconstructions were also generated. COMPARISON:  CT abdomen/pelvis 11/12/2013 FINDINGS: Segmentation: There are five lumbar type vertebral bodies. The last full intervertebral disc space is labeled L5-S1. This correlates with the radiographs. Alignment: Normal Vertebrae: Acute compression fracture of L3 with mild retropulsion but no significant canal stenosis. No involvement of the pedicles or posterior elements. The other lumbar vertebral bodies are maintained. There is moderate age advanced osteoporosis. Paraspinal and other soft tissues: No significant findings. Advanced atherosclerotic calcifications involving the aorta and branch vessel ostia. Disc levels: No significant disc protrusions, spinal or foraminal stenosis IMPRESSION: Acute L3 compression fracture with mild retropulsion (approximately 5.5 mm) and mild spinal canal stenosis. Electronically Signed   By: Rudie Meyer M.D.   On: 08/21/2017 21:53     EKG: Independently reviewed.  Sinus rhythm, QTC 444, T-wave inversion in inferior leads and V5-V6, RAD, poor R-wave progression, anteroseptal infarction pattern.  Assessment/Plan Principal Problem:   Acute on chronic respiratory failure with hypoxia (HCC) Active Problems:   Essential hypertension   HLD (hyperlipidemia)   GERD (gastroesophageal reflux disease)   Tobacco abuse   Back pain   COPD with acute exacerbation (HCC)   Sepsis (HCC)   Chronic systolic CHF (congestive heart failure) (HCC)   CAD (coronary artery disease)   Chest pain   Acute on chronic respiratory failure with hypoxia due to possible COPD exacerbation and sepsis: Patient has productive cough, diffuse rhonchi on auscultation, no infiltration on chest x-ray, consistent with COPD exacerbation. Patient does not carry diagnosis of COPD, but he is current smoker, and CT scan showed severe emphysematous change, indicating patient has undiagnosed COPD. Patient meets criteria for sepsis with leukocytosis, tachycardia  and tachypnea. Pending lacticacidemia. Currently hemodynamically stable.  -will place on tele bed for obs -Nebulizers: scheduled Atrovent and prn Xopenex Nebs -Solu-Medrol 60 mg IV bid -Z pak -Mucinex for cough  -Incentive spirometry -Urine drug screen, HIV -Follow up blood culture x2, sputum culture, respiratory virus panel, Flu pcr -Nasal cannula oxygen as needed to maintain O2 saturation 92% or greater -will get Procalcitonin and trend lactic acid levels per sepsis protocol. -IVF: 2L of NS bolus in ED  (patient has a congestive heart failure, limiting aggressive IV fluids treatment).;s  Back pain: CT scan showed acute L3 compression fracture with mild retropulsion (approximately 5.5 mm) and mild spinal canal stenosis. Dr. Marilynn Rail of neurosurgeon was consulted by EDP. He recommended to follow up patient in office as outpatient. Per EDP physician, if patient is admitted to hospital, we can call neuro surgery in morning again, they will come to the hospital to see patient. -prn Flexeril, Percocet, morphine, Soma -Apply TLSO  -pt/ot  Essential hypertension: -IV Hydralazine prn -Continue home medications: Coreg, lisinopril  HLD:  -Pravastatin  GERD: -Protonix  Tobacco abuse: -Did counseling about importance of quitting smoking -Nicotine patch  Chronic systolic CHF (congestive heart failure) [facility]): Patient had EF of 30% by left cardiac cath in [facility] on 01/27/27. Patient does not have leg edema or JVD. CHF seems to be compensated. -check BNP -continue spironolactone  Hx of CAD and chest pain: s/p of stent. Patient reports mild chest pain. EKG showed T-wave inversion in inferior leads and V5-V6. May be due to demand ischemia secondary to sepsis. - cycle CE q6 x3 and repeat EKG in the am  - prn Nitroglycerin, Morphine, and aspirin, plavix, pravastatin, Coreg - Risk factor stratification: will check FLP and A1C  - 2d echo   DVT ppx:  SQ Lovenox Code Status: Full code Family  Communication: None at bed side. Disposition Plan:  Anticipate discharge back to previous home environment Consults called:  Neurosurgery, Dr. Marilynn Rail Admission status: Obs / tele     Date of Service 08/22/2017    Lorretta Harp [facility] Pager [phone]  If 7PM-7AM, please contact night-coverage [email or web address] Password [email or web address] 08/22/2017, 1:46 AM

## 2017-08-22 NOTE — Progress Notes (Signed)
  PROGRESS NOTE    Gabriel Gibbs  ZOX:[id number] DOB: 01/14/1951 DOA: 08/21/2017 PCP: Quitman LivingsHassan, Sami, MD   Chief Complaint  Patient presents with  . Back Pain    Brief Narrative:  HPI on 08/22/2017 by Dr. Lorretta HarpXilin Niu Gabriel Gibbs is a 67 y.o. male with medical history significant of hypertension, hyperlipidemia, tobacco abuse, possible COPD, on 2 L nasal cannula oxygen, GERD, psoriasis, tobacco abuse, chronic back pain and neck pain, CAD, s/p of stent placement, sCHF (EF 30% by L cardiac cath on 01/26/17), who presents with back pain, shortness of breath, cough and chest pain.  Patient states that he was lifting something heavy on his tractor-trailer when he suddenly felt severe sharp pain in his lower back this AM. He states he felt a pop. The pain is constant, sharp, severe, radiating to both thighs, tingliness in both thighs, no loss of control of bowel movement or bladder. It is aggravated by movement. He also reports cough and shortness breath which has been going on for 2 days. He coughs up white mucus, no fever or chills. Denies runny nose or sore throat. Patient states that he has chest pain, which is located in the upper chest, constant, dull, 2 out of 10 in severity, nonradiating. Denies symptoms of UTI or unilateral weakness.  Assessment & Plan  Admitted earlier today by Dr. Lorretta HarpXilin Niu. See H&P for details.  Acute on chronic respiratory failure with hypoxia/ COPD exacerbation -Patient states he's had a productive cough with upper respiratory type symptoms for the past month -Continues to smoke -Continue nebulizer treatments, azithromycin, Solu-Medrol -Pending influenza PCR and respiratory viral panel -Will add on Tessalon Perles for cough  SIRS secondary to possible Upper respiratory infection -Presents with tachycardia and tachypnea, leukocytosis and elevated lactic acid -Chest x-ray reviewed: unremarkable for infection, however showed emphysema -UA unremarkable -Continue  azithromycin and workup as above -Will hold off on further IV fluid given patient has an EF of 30%  Back pain/L3 compression fracture -Noted on CT -Neurosurgery consulted and appreciated recommended outpatient follow-up with Dr. [name]. Currently fracture is nonoperative. TLSO brace to be placed.  -Continue pain control  Chest pain/ history of CAD -states it is across his chest and worsens with coughing -will continue to cycle troponins- currently negative 2 -echocardiogram pending  -Continue pain control, aspirin, Plavix, Coreg, statin  Chronic systolic CHF -Patient had cardiac catheterization done at outside facility (01/27/2107) showing an EF of 30% -Continue to monitor intake and output, daily weights -Continue Coreg, lisinopril, spironolactone -Echocardiogram pending  Essential hypertension -Continue Coreg, lisinopril, hydralazine as needed  Hyperlipidemia -Continue statin  GERD -Continue PPI  Tobacco abuse -Discussed smoking cessation -Continue nicotine patch  DVT Prophylaxis  lovenox  Code Status: Full  Family Communication: None at bedside  Disposition Plan: Observation, likely discharge to home on 08/23/2017  Consultants Neurosurgery   Procedures  Echocardiogram   LOS: 0 days   Time Spent in minutes   30 minutes  Borna Wessinger Gibbs.O. on 08/22/2017 at 10:38 AM  Between 7am to 7pm - Pager - [phone]  After 7pm go to [email or web address] - password [email or web address]  And look for the night coverage person covering for me after hours  [facility] Office  [phone]

## 2017-08-22 NOTE — Progress Notes (Signed)
Received call from EDP regarding patient's lumbar CT which shows acute L3 compression fracture. Patient is reportedly neuro intact. Rec TLSO brace when OOB and upright. Will see later today in consult.

## 2017-08-22 NOTE — Progress Notes (Signed)
  Echocardiogram 2D Echocardiogram has been performed.  Gabriel SavoyCasey N Amaris Gibbs 08/22/2017, 10:45 AM

## 2017-08-22 NOTE — Progress Notes (Signed)
[facility] Progress Note Patient Details:  Gabriel KeenWilliam D Gibbs 06/21/1951 [id number]  Patient ID: Gabriel KeenWilliam D Gibbs, male   DOB: 02/07/1951, 67 y.o.   MRN: [id number]   Gabriel FordyceJennifer C Mccall Gibbs 08/22/2017, 8:31 AMCalled [employer] for TLSO brace.

## 2017-08-22 NOTE — Progress Notes (Signed)
Notified MD of critical lab of lactic acid 2.0

## 2017-08-22 NOTE — Consult Note (Signed)
Chief Complaint   Chief Complaint  Patient presents with  . Back Pain    HPI   HPI: Gabriel Gibbs is a 67 y.o. male who presented to ER yesterday evening after suffering lower back injury. Was lifting a drum and felt a pop in lower back. Resulted in immediate, severe pain. Pain worse with movement. Does endorse some tingling in thighs, but denies motor/sensory deficits. Has chronic RLE weakness from accident several years ago. Able to ambulate. Denies bowel or bladder dysfunction. History of ACDF by Dr Saintclair Halsted. Is currently admitted for other medical issues under [facility].   Patient Active Problem List   Diagnosis Date Noted  . Back pain 08/22/2017  . COPD with acute exacerbation ([place]) 08/22/2017  . Sepsis ([place]) 08/22/2017  . Acute on chronic respiratory failure with hypoxia ([place]) 08/22/2017  . Chronic systolic CHF (congestive heart failure) ([place]) 08/22/2017  . CAD (coronary artery disease) 08/22/2017  . Chest pain 08/22/2017  . Essential hypertension   . HLD (hyperlipidemia)   . GERD (gastroesophageal reflux disease)   . Tobacco abuse     PMH: Past Medical History:  Diagnosis Date  . CAD (coronary artery disease)   . Essential hypertension   . GERD (gastroesophageal reflux disease)   . Headache(784.0)   . HLD (hyperlipidemia)   . Hx of bronchitis   . Psoriasis   . Shortness of breath   . Tobacco abuse     PSH: Past Surgical History:  Procedure Laterality Date  . ANTERIOR CERVICAL DECOMP/DISCECTOMY FUSION N/A 05/24/2013   Procedure: CERVICAL FOUR-FIVE ANTERIOR CERVICAL DISCECTOMY FUSION;  Surgeon: Elaina Hoops, MD;  Location: [facility];  Service: Neurosurgery;  Laterality: N/A;  . LEG SURGERY     right  . LIVER SURGERY     part of liver removed  . [place]  . SPLENECTOMY, TOTAL      Medications Prior to Admission  Medication Sig Dispense Refill Last Dose  . albuterol (ACCUNEB) 1.25 MG/3ML nebulizer solution Take 2 ampules by nebulization daily.     08/21/2017 at Unknown time  . albuterol (PROVENTIL HFA;VENTOLIN HFA) 108 (90 BASE) MCG/ACT inhaler Inhale 2 puffs into the lungs 4 (four) times daily as needed for wheezing.   08/21/2017 at Unknown time  . aspirin EC 81 MG tablet Take 81 mg by mouth daily.   08/21/2017 at Unknown time  . carisoprodol (SOMA) 350 MG tablet Take 350 mg by mouth 3 (three) times daily as needed for muscle spasms.   08/21/2017 at Unknown time  . carvedilol (COREG) 6.25 MG tablet Take 6.25 mg by mouth 2 (two) times daily.   08/21/2017 at 0800  . clopidogrel (PLAVIX) 75 MG tablet Take 75 mg by mouth daily.   08/21/2017 at Unknown time  . lisinopril (PRINIVIL,ZESTRIL) 20 MG tablet Take 20 mg by mouth daily.   08/21/2017 at Unknown time  . oxyCODONE-acetaminophen (PERCOCET) 10-325 MG per tablet Take 1 tablet by mouth 3 (three) times daily as needed for pain.   08/21/2017 at Unknown time  . pantoprazole (PROTONIX) 40 MG tablet Take 40 mg by mouth daily.   08/21/2017 at Unknown time  . pravastatin (PRAVACHOL) 20 MG tablet Take 20 mg by mouth every evening.    08/20/2017 at Unknown time  . spironolactone (ALDACTONE) 25 MG tablet Take 25 mg by mouth daily.   08/21/2017 at Unknown time  . cyclobenzaprine (FLEXERIL) 10 MG tablet Take 1 tablet (10 mg total) by mouth 3 (three) times  daily as needed for muscle spasms. (Patient not taking: Reported on 08/21/2017) 80 tablet 1 Completed Course at Unknown time  . oxyCODONE (ROXICODONE) 5 MG immediate release tablet Take 3 tablets (15 mg total) by mouth every 4 (four) hours as needed for pain. (Patient not taking: Reported on 08/21/2017) 60 tablet 0 Completed Course at Unknown time    SH: Social History   Tobacco Use  . Smoking status: Current Every Day Smoker    Packs/day: 2.00    Years: 45.00    Pack years: 90.00    Types: Cigarettes  . Smokeless tobacco: Never Used  Substance Use Topics  . Alcohol use: No    Frequency: Never  . Drug use: No    MEDS: Prior to Admission medications    Medication Sig Start Date End Date Taking? Authorizing Provider  albuterol (ACCUNEB) 1.25 MG/3ML nebulizer solution Take 2 ampules by nebulization daily.    Yes [provider]  albuterol (PROVENTIL HFA;VENTOLIN HFA) 108 (90 BASE) MCG/ACT inhaler Inhale 2 puffs into the lungs 4 (four) times daily as needed for wheezing.   Yes [provider]  aspirin EC 81 MG tablet Take 81 mg by mouth daily.   Yes [provider]  carisoprodol (SOMA) 350 MG tablet Take 350 mg by mouth 3 (three) times daily as needed for muscle spasms.   Yes [provider]  carvedilol (COREG) 6.25 MG tablet Take 6.25 mg by mouth 2 (two) times daily. 06/23/17  Yes [provider]  clopidogrel (PLAVIX) 75 MG tablet Take 75 mg by mouth daily. 07/29/17  Yes [provider]  lisinopril (PRINIVIL,ZESTRIL) 20 MG tablet Take 20 mg by mouth daily. 06/04/17  Yes [provider]  oxyCODONE-acetaminophen (PERCOCET) 10-325 MG per tablet Take 1 tablet by mouth 3 (three) times daily as needed for pain.   Yes [provider]  pantoprazole (PROTONIX) 40 MG tablet Take 40 mg by mouth daily. 07/29/17  Yes [provider]  pravastatin (PRAVACHOL) 20 MG tablet Take 20 mg by mouth every evening.  07/29/17  Yes [provider]  spironolactone (ALDACTONE) 25 MG tablet Take 25 mg by mouth daily. 07/29/17  Yes [provider]  cyclobenzaprine (FLEXERIL) 10 MG tablet Take 1 tablet (10 mg total) by mouth 3 (three) times daily as needed for muscle spasms. Patient not taking: Reported on 08/21/2017 05/26/13   Kary Kos, MD  oxyCODONE (ROXICODONE) 5 MG immediate release tablet Take 3 tablets (15 mg total) by mouth every 4 (four) hours as needed for pain. Patient not taking: Reported on 08/21/2017 05/26/13   Kary Kos, MD    ALLERGY: No Known Allergies  Social History   Tobacco Use  . Smoking status: Current Every Day Smoker    Packs/day: 2.00    Years:  45.00    Pack years: 90.00    Types: Cigarettes  . Smokeless tobacco: Never Used  Substance Use Topics  . Alcohol use: No    Frequency: Never     Family History  Problem Relation Age of Onset  . Arthritis Mother   . Diabetes Mellitus II Father   . Heart disease Father      ROS   Review of Systems  Constitutional: Negative.   HENT: Negative.   Eyes: Negative.   Respiratory: Positive for cough, shortness of breath and wheezing.   Cardiovascular: Positive for chest pain.  Gastrointestinal: Negative.   Genitourinary: Negative.   Musculoskeletal: Positive for back pain and myalgias. Negative for falls, joint  pain and neck pain.  Skin: Negative.   Neurological: Positive for tingling. Negative for dizziness, tremors, sensory change, speech change, focal weakness, seizures, loss of consciousness and headaches.    Exam   Vitals:   08/22/17 0641 08/22/17 0811  BP: (!) 110/45   Pulse: 71   Resp: 17   Temp: 98.5 F (36.9 C)   SpO2: (!) 86% 90%   General appearance: WDWN, resting comfortably, NAD Eyes: PERRL, Fundoscopic: normal Cardiovascular: Regular rate and rhythm without murmurs, rubs, gallops. No edema or variciosities. Distal pulses normal. Pulmonary: Clear to auscultation Musculoskeletal:     Muscle tone upper extremities: Normal    Muscle tone lower extremities: Normal    Motor exam: Upper Extremities Deltoid Bicep Tricep Grip  Right 5/5 5/5 5/5 5/5  Left 5/5 5/5 5/5 5/5   Lower Extremity IP Quad PF DF EHL  Right 4/5 4/5 4/5 4/5 4/5  Left 5/5 5/5 5/5 5/5 5/5   Neurological Awake, alert, oriented Memory and concentration grossly intact Speech fluent, appropriate CNII: Visual fields normal CNIII/IV/VI: EOMI CNV: Facial sensation normal CNVII: Symmetric, normal strength CNVIII: Grossly normal CNIX: Normal palate movement CNXI: Trap and SCM strength normal CN XII: Tongue protrusion normal Sensation grossly intact to LT DTR: Normal Coordination  (finger/nose & heel/shin): Normal  Results - Imaging/Labs   Results for orders placed or performed during the hospital encounter of 08/21/17 (from the past 48 hour(s))  Basic metabolic panel     Status: Abnormal   Collection Time: 08/21/17  6:38 PM  Result Value Ref Range   Sodium 138 135 - 145 mmol/L   Potassium 4.2 3.5 - 5.1 mmol/L   Chloride 99 (L) 101 - 111 mmol/L   CO2 27 22 - 32 mmol/L   Glucose, Bld 152 (H) 65 - 99 mg/dL   BUN 21 (H) 6 - 20 mg/dL   Creatinine, Ser 0.83 0.61 - 1.24 mg/dL   Calcium 9.2 8.9 - 10.3 mg/dL   GFR calc non Af Amer >60 >60 mL/min   GFR calc Af Amer >60 >60 mL/min    Comment: (NOTE) The eGFR has been calculated using the CKD EPI equation. This calculation has not been validated in all clinical situations. eGFR's persistently <60 mL/min signify possible Chronic Kidney Disease.    Anion gap 12 5 - 15  CBC     Status: Abnormal   Collection Time: 08/21/17  6:38 PM  Result Value Ref Range   WBC 17.0 (H) 4.0 - 10.5 K/uL   RBC 4.65 4.22 - 5.81 MIL/uL   Hemoglobin 15.1 13.0 - 17.0 g/dL   HCT 45.3 39.0 - 52.0 %   MCV 97.4 78.0 - 100.0 fL   MCH 32.5 26.0 - 34.0 pg   MCHC 33.3 30.0 - 36.0 g/dL   RDW 14.6 11.5 - 15.5 %   Platelets 331 150 - 400 K/uL  I-stat troponin, ED     Status: None   Collection Time: 08/21/17  6:41 PM  Result Value Ref Range   Troponin i, poc 0.00 0.00 - 0.08 ng/mL   Comment 3            Comment: Due to the release kinetics of cTnI, a negative result within the first hours of the onset of symptoms does not rule out myocardial infarction with certainty. If myocardial infarction is still suspected, repeat the test at appropriate intervals.   Troponin I (q 6hr x 3)     Status: None   Collection Time: 08/22/17  1:53 AM  Result Value Ref Range   Troponin I <0.03 <0.03 ng/mL  Lactic acid, plasma     Status: Abnormal   Collection Time: 08/22/17  1:53 AM  Result Value Ref Range   Lactic Acid, Venous 2.0 (HH) 0.5 - 1.9 mmol/L     Comment: CRITICAL RESULT CALLED TO, READ BACK BY AND VERIFIED WITH: S.JOHNSON,RN 0309 08/22/17 [email or web address]   Procalcitonin     Status: None   Collection Time: 08/22/17  1:53 AM  Result Value Ref Range   Procalcitonin <0.10 ng/mL    Comment:        Interpretation: PCT (Procalcitonin) <= 0.5 ng/mL: Systemic infection (sepsis) is not likely. Local bacterial infection is possible. (NOTE)       Sepsis PCT Algorithm           Lower Respiratory Tract                                      Infection PCT Algorithm    ----------------------------     ----------------------------         PCT < 0.25 ng/mL                PCT < 0.10 ng/mL         Strongly encourage             Strongly discourage   discontinuation of antibiotics    initiation of antibiotics    ----------------------------     -----------------------------       PCT 0.25 - 0.50 ng/mL            PCT 0.10 - 0.25 ng/mL               OR       >80% decrease in PCT            Discourage initiation of                                            antibiotics      Encourage discontinuation           of antibiotics    ----------------------------     -----------------------------         PCT >= 0.50 ng/mL              PCT 0.26 - 0.50 ng/mL               AND        <80% decrease in PCT             Encourage initiation of                                             antibiotics       Encourage continuation           of antibiotics    ----------------------------     -----------------------------        PCT >= 0.50 ng/mL                  PCT > 0.50 ng/mL               AND  increase in PCT                  Strongly encourage                                      initiation of antibiotics    Strongly encourage escalation           of antibiotics                                     -----------------------------                                           PCT <= 0.25 ng/mL                                                 OR                                         > 80% decrease in PCT                                     Discontinue / Do not initiate                                             antibiotics   Urinalysis, Routine w reflex microscopic     Status: Abnormal   Collection Time: 08/22/17  2:05 AM  Result Value Ref Range   Color, Urine YELLOW YELLOW   APPearance CLEAR CLEAR   Specific Gravity, Urine >1.046 (H) 1.005 - 1.030   pH 5.0 5.0 - 8.0   Glucose, UA NEGATIVE NEGATIVE mg/dL   Hgb urine dipstick NEGATIVE NEGATIVE   Bilirubin Urine NEGATIVE NEGATIVE   Ketones, ur NEGATIVE NEGATIVE mg/dL   Protein, ur NEGATIVE NEGATIVE mg/dL   Nitrite NEGATIVE NEGATIVE   Leukocytes, UA NEGATIVE NEGATIVE  Hemoglobin A1c     Status: Abnormal   Collection Time: 08/22/17  5:34 AM  Result Value Ref Range   Hgb A1c MFr Bld 6.5 (H) 4.8 - 5.6 %    Comment: (NOTE) Pre diabetes:          5.7%-6.4% Diabetes:              >6.4% Glycemic control for   <7.0% adults with diabetes    Mean Plasma Glucose 139.85 mg/dL  Lipid panel     Status: None   Collection Time: 08/22/17  5:34 AM  Result Value Ref Range   Cholesterol 165 0 - 200 mg/dL   Triglycerides 61 <150 mg/dL   HDL 58 >40 mg/dL   Total CHOL/HDL Ratio 2.8 RATIO   VLDL 12 0 - 40 mg/dL   LDL Cholesterol 95 0 - 99 mg/dL    Comment:  Total Cholesterol/HDL:CHD Risk Coronary Heart Disease Risk Table                     Men   Women  1/2 Average Risk   3.4   3.3  Average Risk       5.0   4.4  2 X Average Risk   9.6   7.1  3 X Average Risk  23.4   11.0        Use the calculated Patient Ratio above and the CHD Risk Table to determine the patient's CHD Risk.        ATP III CLASSIFICATION (LDL):  <100     mg/dL   Optimal  100-129  mg/dL   Near or Above                    Optimal  130-159  mg/dL   Borderline  160-189  mg/dL   High  >190     mg/dL   Very High   Lactic acid, plasma     Status: Abnormal   Collection Time: 08/22/17  5:34 AM  Result Value Ref Range   Lactic  Acid, Venous 3.8 (HH) 0.5 - 1.9 mmol/L    Comment: CRITICAL RESULT CALLED TO, READ BACK BY AND VERIFIED WITH: S.JOHNSON,RN 0715 08/22/17 G.[email or web address]   Troponin I (q 6hr x 3)     Status: None   Collection Time: 08/22/17  5:42 AM  Result Value Ref Range   Troponin I <0.03 <0.03 ng/mL    Dg Chest 2 View  Result Date: 08/21/2017 CLINICAL DATA:  Center and right lumbar pain after lifting heavy break drum. EXAM: CHEST  2 VIEW COMPARISON:  January 23, 2017 FINDINGS: The mediastinal contour is normal. The heart size is mildly enlarged. The lungs are hyperinflated. Mild atelectasis of left lung base is noted. There is no focal pneumonia or pleural effusion. The visualized skeletal structures are unremarkable. IMPRESSION: No active cardiopulmonary disease.  Emphysema. Electronically Signed   By: Abelardo Diesel M.D.   On: 08/21/2017 20:12   Dg Lumbar Spine Complete  Result Date: 08/21/2017 CLINICAL DATA:  Back pain after lifting heavy break drum. EXAM: LUMBAR SPINE - COMPLETE 4+ VIEW COMPARISON:  Abdomen x-ray November 14, 2013 CT abdomen and pelvis November 12, 2013 FINDINGS: There is compression deformity of L3 vertebral body with question mild cortical irregularity of the superior endplate, acute compression fracture is not excluded. There is no malalignment. Degenerative joint changes of the spine are noted. IMPRESSION: Mild compression deformity of L3 vertebral body with question mild cortical irregularity of the superior endplate, acute compression fracture is not excluded. This finding is new compared to prior films from 2015. Electronically Signed   By: Abelardo Diesel M.D.   On: 08/21/2017 20:14   Ct Angio Chest Pe W And/or Wo Contrast  Result Date: 08/21/2017 CLINICAL DATA:  Back injury after lifting heavy object today. History of bronchitis and psoriasis. EXAM: CT ANGIOGRAPHY CHEST WITH CONTRAST TECHNIQUE: Multidetector CT imaging of the chest was performed using the standard protocol during bolus administration  of intravenous contrast. Multiplanar CT image reconstructions and MIPs were obtained to evaluate the vascular anatomy. CONTRAST:  47 cc ISOVUE-370 IOPAMIDOL (ISOVUE-370) INJECTION 76% COMPARISON:  Chest radiograph August 21, 2017 and CT chest August 21, 2016 FINDINGS: CARDIOVASCULAR: Adequate contrast opacification of the pulmonary artery's. Main pulmonary artery is not enlarged. No pulmonary arterial filling defects to the level of the subsegmental branches. Heart size is  mildly enlarged, no right heart strain. Trace coronary artery calcifications. Mild pericardial wall thickening. Thoracic aorta is normal course and caliber, mild calcific atherosclerosis aortic arch. MEDIASTINUM/NODES: No lymphadenopathy by CT size criteria. LUNGS/PLEURA: Tracheobronchial tree is patent, no pneumothorax. Mild bronchial wall thickening. Marked biapical bullous changes with severe centrilobular emphysema. Bibasilar dependent atelectasis. UPPER ABDOMEN: Status post splenectomy with splenosis. Multiple surgical clips in upper abdomen. Colonic intra positioning. MUSCULOSKELETAL: Old moderate to severe T7 compression fracture with 50-75% height loss. Osteopenia. Multilevel Schmorl's nodes. Review of the MIP images confirms the above findings. IMPRESSION: 1. No acute pulmonary embolism. 2. Severe emphysema. Bronchial wall thickening seen with reactive airway disease and bronchitis. No pneumonia. Aortic Atherosclerosis (ICD10-I70.0) and Emphysema (ICD10-J43.9). Electronically Signed   By: Elon Alas M.D.   On: 08/21/2017 22:29   Ct Lumbar Spine Wo Contrast  Result Date: 08/21/2017 CLINICAL DATA:  Lifting injury. Back pain. Abnormal lumbar spine radiographs showing and L3 compression fracture. EXAM: CT LUMBAR SPINE WITHOUT CONTRAST TECHNIQUE: Multidetector CT imaging of the lumbar spine was performed without intravenous contrast administration. Multiplanar CT image reconstructions were also generated. COMPARISON:  CT  abdomen/pelvis 11/12/2013 FINDINGS: Segmentation: There are five lumbar type vertebral bodies. The last full intervertebral disc space is labeled L5-S1. This correlates with the radiographs. Alignment: Normal Vertebrae: Acute compression fracture of L3 with mild retropulsion but no significant canal stenosis. No involvement of the pedicles or posterior elements. The other lumbar vertebral bodies are maintained. There is moderate age advanced osteoporosis. Paraspinal and other soft tissues: No significant findings. Advanced atherosclerotic calcifications involving the aorta and branch vessel ostia. Disc levels: No significant disc protrusions, spinal or foraminal stenosis IMPRESSION: Acute L3 compression fracture with mild retropulsion (approximately 5.5 mm) and mild spinal canal stenosis. Electronically Signed   By: Marijo Sanes M.D.   On: 08/21/2017 21:53    Impression/Plan   68 y.o. male with acute L3 compression fracture with minimal retropulsion. Neuro intact with exception of chronic RLE weakness from an accident several years ago. Compression fracture is non-operative. Placed in TLSO brace. Can follow up outpt with Dr Saintclair Halsted.   Continue current medical management by primary team for COPD exacerbation. Will sign off. Please call for any concerns.

## 2017-08-22 NOTE — Procedures (Signed)
Patient has his own Ventolin inhaler beside and refuses to allow RN to take and keep while here.

## 2017-08-22 NOTE — Progress Notes (Signed)
Pt has ventolin inhaler and personal meds from home. Pt refuses for nurse to send home medications to pharmacy. Explained to pt that his meds would be given back to him at d/c. Pt still refuses for meds to be sent to pharmacy. Pt states he will not take any of his home medications while he is here in the hospital. Will continue to monitor pt. Nelda MarseilleJenny Thacker, RN

## 2017-08-22 NOTE — Progress Notes (Signed)
MD notified of critical lab of lactic acid of 3.8

## 2017-08-22 NOTE — Evaluation (Signed)
Physical Therapy Evaluation Patient Details Name: Gabriel Gibbs MRN: [id number] DOB: 10-Dec-1950 Today's Date: 08/22/2017   History of Present Illness  Gabriel Gibbs is a 67 y.o. male with medical history significant of hypertension, hyperlipidemia, tobacco abuse, possible COPD, on 2 L nasal cannula oxygen, GERD, psoriasis, tobacco abuse, chronic back pain and neck pain, CAD, s/p of stent placement, sCHF (EF 30% by L cardiac cath on 01/26/17), who presents with back pain, shortness of breath, cough and chest pain. CT showed L3 compression fx.  Clinical Impression  Pt admitted with above diagnosis. Pt currently with functional limitations due to the deficits listed below (see PT Problem List). Pt assisted with donning TLSO and ambulated with room with and without RW. Pt mildly unsteady without RW but stood with more erect posture. Plans to use cane at home. O2 sats dropped to 79% with ambulation on RA. Returned to 89% on 4L O2.  Pt will benefit from skilled PT to increase their independence and safety with mobility to allow discharge to the venue listed below.       Follow Up Recommendations No PT follow up    Equipment Recommendations  None recommended by PT    Recommendations for Other Services       Precautions / Restrictions Precautions Precautions: Back Precaution Booklet Issued: No Precaution Comments: pt aware of back precautions due to previous lumbar surgery and keeps at basline Required Braces or Orthoses: Spinal Brace Spinal Brace: Thoracolumbosacral orthotic;Applied in sitting position Restrictions Weight Bearing Restrictions: No      Mobility  Bed Mobility Overal bed mobility: Needs Assistance Bed Mobility: Sit to Supine       Sit to supine: Supervision   General bed mobility comments: supervision with vc's for mindfulness of back precautions  Transfers Overall transfer level: Needs assistance Equipment used: Rolling walker (2 wheeled);None Transfers: Sit  to/from Stand Sit to Stand: Min guard         General transfer comment: close guarding due to unsteadiness  Ambulation/Gait Ambulation/Gait assistance: Min guard Ambulation Distance (Feet): 30 Feet Assistive device: Rolling walker (2 wheeled);None Gait Pattern/deviations: Step-through pattern;Drifts right/left Gait velocity: decreased Gait velocity interpretation: Below normal speed for age/gender General Gait Details: pt ambulated 5' with RW and supervision, he declined using RW further. He did stand with more erect posture without RW but min-guard A given for safety as pt with right and left drift and appeared unsteady. Pt reports he prefers his cane.   Stairs            Wheelchair Mobility    Modified Rankin (Stroke Patients Only)       Balance Overall balance assessment: Needs assistance Sitting-balance support: Single extremity supported Sitting balance-Leahy Scale: Good     Standing balance support: No upper extremity supported Standing balance-Leahy Scale: Fair                               Pertinent Vitals/Pain Pain Assessment: Faces Faces Pain Scale: Hurts even more Pain Location: back Pain Descriptors / Indicators: Aching Pain Intervention(s): Limited activity within patient's tolerance;Monitored during session    Home Living Family/patient expects to be discharged to:: Private residence Living Arrangements: Alone Available Help at Discharge: Family;Available PRN/intermittently Type of Home: House         Home Equipment: Gilmer Mor - single point Additional Comments: pt's mother lives next door and she still drives and is independent and can help him with shopping,  etc. He has access to RW.     Prior Function Level of Independence: Independent with assistive device(s)         Comments: SPC on occasion     Hand Dominance   Dominant Hand: Right    Extremity/Trunk Assessment   Upper Extremity Assessment Upper Extremity  Assessment: LUE deficits/detail LUE Deficits / Details: reports that he has LUE weakness and pain since cervical fusion some years back.     Lower Extremity Assessment Lower Extremity Assessment: Overall WFL for tasks assessed    Cervical / Trunk Assessment Cervical / Trunk Assessment: Kyphotic  Communication   Communication: No difficulties  Cognition Arousal/Alertness: Awake/alert Behavior During Therapy: WFL for tasks assessed/performed Overall Cognitive Status: Within Functional Limits for tasks assessed                                        General Comments General comments (skin integrity, edema, etc.): min A to don TLSO. O2 sats 79% on RA, when questioned, pt reports he uses 2L O2 at home. Placed back on 4L O2 and O2 sats rose to 89%.     Exercises     Assessment/Plan    PT Assessment Patient needs continued PT services  PT Problem List Decreased activity tolerance;Decreased balance;Decreased mobility;Decreased knowledge of use of DME;Pain;Cardiopulmonary status limiting activity       PT Treatment Interventions DME instruction;Gait training;Functional mobility training;Therapeutic activities;Therapeutic exercise;Balance training;Patient/family education    PT Goals (Current goals can be found in the Care Plan section)  Acute Rehab PT Goals Patient Stated Goal: return home PT Goal Formulation: With patient Time For Goal Achievement: 09/05/17 Potential to Achieve Goals: Good    Frequency Min 3X/week   Barriers to discharge        Co-evaluation               AM-PAC PT "6 Clicks" Daily Activity  Outcome Measure Difficulty turning over in bed (including adjusting bedclothes, sheets and blankets)?: A Little Difficulty moving from lying on back to sitting on the side of the bed? : A Little Difficulty sitting down on and standing up from a chair with arms (e.g., wheelchair, bedside commode, etc,.)?: A Little Help needed moving to and from a  bed to chair (including a wheelchair)?: A Little Help needed walking in hospital room?: A Little Help needed climbing 3-5 steps with a railing? : A Little 6 Click Score: 18    End of Session Equipment Utilized During Treatment: Gait belt Activity Tolerance: Patient tolerated treatment well Patient left: in bed;with call bell/phone within reach Nurse Communication: Mobility status PT Visit Diagnosis: Unsteadiness on feet (R26.81);Pain;Difficulty in walking, not elsewhere classified (R26.2) Pain - part of body: (back)    Time: 1208-1228 PT Time Calculation (min) (ACUTE ONLY): 20 min   Charges:   PT Evaluation $PT Eval Moderate Complexity: 1 Mod     PT G Codes:        Lyanne CoVictoria Ryenne Gibbs, PT  Acute Rehab Services  [phone]   Gabriel Gibbs 08/22/2017, 2:16 PM

## 2017-08-22 NOTE — Progress Notes (Signed)
Gabriel Gibbs is a 67 y.o. male patient admitted from ED awake, alert - oriented  X 4 - no acute distress noted.  VSS - Blood pressure 115/68, pulse (!) 114, temperature 98.8 F (37.1 C), temperature source Oral, resp. rate 14, height 5\' 9"  (1.753 m), weight 59.9 kg (132 lb), SpO2 94 %.    IV in place, occlusive dsg intact without redness.  Orientation to room, and floor completed with information packet given to patient/family.  Patient declined safety video at this time.  Admission INP armband ID verified with patient/family, and in place.   SR up x 1, fall assessment complete, with patient and family able to verbalize understanding of risk associated with falls, and verbalized understanding to call nsg before up out of bed.  Call light within reach, patient able to voice, and demonstrate understanding.  Skin, clean-dry- intact with evidence of psoriasis to bilateral elbows and sacral area. No evidence of skin tears.   No evidence of skin break down noted on exam.     Will cont to eval and treat per MD orders.  Orene DesanctisSTANISHA  Shatonia Hoots, RN 08/22/2017 3:33 AM

## 2017-08-23 DIAGNOSIS — R079 Chest pain, unspecified: Secondary | ICD-10-CM

## 2017-08-23 DIAGNOSIS — S32030A Wedge compression fracture of third lumbar vertebra, initial encounter for closed fracture: Secondary | ICD-10-CM

## 2017-08-23 DIAGNOSIS — M545 Low back pain: Secondary | ICD-10-CM

## 2017-08-23 DIAGNOSIS — K219 Gastro-esophageal reflux disease without esophagitis: Secondary | ICD-10-CM

## 2017-08-23 DIAGNOSIS — I10 Essential (primary) hypertension: Secondary | ICD-10-CM

## 2017-08-23 DIAGNOSIS — E785 Hyperlipidemia, unspecified: Secondary | ICD-10-CM

## 2017-08-23 DIAGNOSIS — R6510 Systemic inflammatory response syndrome (SIRS) of non-infectious origin without acute organ dysfunction: Secondary | ICD-10-CM

## 2017-08-23 DIAGNOSIS — J441 Chronic obstructive pulmonary disease with (acute) exacerbation: Secondary | ICD-10-CM

## 2017-08-23 DIAGNOSIS — I5022 Chronic systolic (congestive) heart failure: Secondary | ICD-10-CM

## 2017-08-23 DIAGNOSIS — R000 Tachycardia, unspecified: Secondary | ICD-10-CM

## 2017-08-23 DIAGNOSIS — R0902 Hypoxemia: Secondary | ICD-10-CM

## 2017-08-23 DIAGNOSIS — Z720 Tobacco use: Secondary | ICD-10-CM

## 2017-08-23 LAB — CBC
HEMATOCRIT: 41.8 % (ref 39.0–52.0)
Hemoglobin: 13.6 g/dL (ref 13.0–17.0)
MCH: 32.2 pg (ref 26.0–34.0)
MCHC: 32.5 g/dL (ref 30.0–36.0)
MCV: 99.1 fL (ref 78.0–100.0)
Platelets: 323 10*3/uL (ref 150–400)
RBC: 4.22 MIL/uL (ref 4.22–5.81)
RDW: 14.9 % (ref 11.5–15.5)
WBC: 15.6 10*3/uL — ABNORMAL HIGH (ref 4.0–10.5)

## 2017-08-23 LAB — BASIC METABOLIC PANEL
ANION GAP: 9 (ref 5–15)
BUN: 24 mg/dL — AB (ref 6–20)
CALCIUM: 9.2 mg/dL (ref 8.9–10.3)
CO2: 29 mmol/L (ref 22–32)
Chloride: 99 mmol/L — ABNORMAL LOW (ref 101–111)
Creatinine, Ser: 0.91 mg/dL (ref 0.61–1.24)
GFR calc Af Amer: >60 mL/min (ref >60)
GFR calc non Af Amer: >60 mL/min (ref >60)
GLUCOSE: 195 mg/dL — AB (ref 65–99)
POTASSIUM: 5.1 mmol/L (ref 3.5–5.1)
Sodium: 137 mmol/L (ref 135–145)

## 2017-08-23 LAB — LACTIC ACID, PLASMA
Lactic Acid, Venous: 2.5 mmol/L (ref 0.5–1.9)
Lactic Acid, Venous: 1.8 mmol/L (ref 0.5–1.9)

## 2017-08-23 MED ORDER — LEVALBUTEROL HCL 1.25 MG/0.5ML IN NEBU
1.2500 mg | INHALATION_SOLUTION | Freq: Two times a day (BID) | RESPIRATORY_TRACT | Status: DC
  Administered 2017-08-23 – 2017-08-24 (×2): 1.25 mg via RESPIRATORY_TRACT
  Filled 2017-08-23 (×2): qty 0.5

## 2017-08-23 MED ORDER — IPRATROPIUM BROMIDE 0.02 % IN SOLN
0.5000 mg | Freq: Two times a day (BID) | RESPIRATORY_TRACT | Status: DC
  Administered 2017-08-23 – 2017-08-24 (×2): 0.5 mg via RESPIRATORY_TRACT
  Filled 2017-08-23 (×2): qty 2.5

## 2017-08-23 MED ORDER — BENZONATATE 100 MG PO CAPS: 100.0000 mg | ORAL_CAPSULE | Freq: Three times a day (TID) | ORAL | Status: DC | PRN

## 2017-08-23 MED ORDER — METHYLPREDNISOLONE SODIUM SUCC 40 MG IJ SOLR
40.0000 mg | Freq: Two times a day (BID) | INTRAMUSCULAR | Status: DC
  Administered 2017-08-23 – 2017-08-24 (×2): 40 mg via INTRAVENOUS
  Filled 2017-08-23 (×2): qty 1

## 2017-08-23 NOTE — Progress Notes (Signed)
CRITICAL VALUE ALERT  Critical Value:  Lactic acid 2.5  Date & Time Notied:  08/23/17 0618  Provider Notified:Bodenhemir, NP  Orders Received/Actions taken: No new orders given.

## 2017-08-23 NOTE — Progress Notes (Signed)
PROGRESS NOTE    Gabriel Gibbs  ZOX:[id number] DOB: 04/11/1951 DOA: 08/21/2017 PCP: Quitman Livings, MD   Chief Complaint  Patient presents with  . Back Pain    Brief Narrative:  HPI on 08/22/2017 by Dr. Serita Grammes D Trotteris a 66 y.o.malewith medical history significant ofhypertension, hyperlipidemia, tobacco abuse, possible COPD, on 2 L nasal cannula oxygen, GERD, psoriasis, tobacco abuse, chronic back painandneck pain, CAD, s/p ofstent placement,sCHF (EF 30% by L cardiac cath on 01/26/17),who presents with back pain, shortness of breath, cough and chest pain.  Patient states that he was lifting something heavy on his tractor-trailer when he suddenly felt severe sharp pain in his lower backthis AM. He states he felt a pop.The pain is constant, sharp, severe, radiating to both thighs, tingliness in both thighs, no lossofcontrolofbowel movement or bladder. It isaggravated by movement.Healso reports cough and shortness breath which has been going on for 2 days. He coughs up white mucus, no fever or chills. Denies runny nose or sore throat. Patient states that he has chest pain, which is located in the upper chest, constant, dull, 2out of 10 in severity, nonradiating. Denies symptoms of UTI or unilateral weakness.  Assessment & Plan   Acute on chronic respiratory failure with hypoxia/ COPD exacerbation -Patient states he's had a productive cough with upper respiratory type symptoms for the past month -Continues to smoke -patient uses 2L of home oxygen when sleeping -Continue nebulizer treatments, azithromycin, Solu-Medrol -will reduce solu-medrol dose -Influenza PCR and respiratory viral panel unremarkable  -Continue antitussives   SIRS secondary to possible Upper respiratory infection -Presents with tachycardia and tachypnea, leukocytosis and elevated lactic acid -Chest x-ray reviewed: unremarkable for infection, however showed emphysema -UA  unremarkable -Continue azithromycin -Will hold off on further IV fluid given patient has an EF of 30% -lactic acidosis resolved  Back pain/L3 compression fracture -Noted on CT -Neurosurgery consulted and appreciated recommended outpatient follow-up with Dr. Wynetta Emery. Currently fracture is nonoperative. TLSO brace to be placed.  -Continue pain control  Chest pain/ history of CAD -states it is across his chest and worsens with coughing -will continue to cycle troponins- currently negative 3 -echocardiogram  EF 40-45, grade 1 diastolic dysfunction -Continue pain control, aspirin, Plavix, Coreg, statin  Chronic systolic CHF -Patient had cardiac catheterization done at outside facility (01/27/2107) showing an EF of 30% -Continue to monitor intake and output, daily weights -Continue Coreg, lisinopril, spironolactone -Echocardiogram EF 40-45, grade 1 diastolic dysfunction  Essential hypertension -Continue Coreg, lisinopril, hydralazine as needed  Hyperlipidemia -Continue statin  GERD -Continue PPI  Tobacco abuse -Discussed smoking cessation -Continue nicotine patch  DVT Prophylaxis  lovenox  Code Status: Full  Family Communication: none at bedside  Disposition Plan: Admitted. Possibly discharge to home on 08/24/2017  Consultants Neurosurgery  Procedures  Echocardiogram  Antibiotics   Anti-infectives (From admission, onward)   Start     Dose/Rate Route Frequency Ordered Stop   08/23/17 1000  azithromycin (ZITHROMAX) tablet 250 mg     250 mg Oral Daily 08/22/17 0132 08/27/17 0959   08/22/17 1000  azithromycin (ZITHROMAX) tablet 500 mg     500 mg Oral Daily 08/22/17 0132 08/22/17 1111      Subjective:   Gabriel Gibbs seen and examined today. Patient continues to complain of back pain. States he feels horrible. Denies chest pain, shortness of breath, abdominal pain, N/V/D/C. Complains of continued cough.  Objective:   Vitals:   08/22/17 2157 08/22/17 2216  08/23/17 0637 08/23/17 0815  BP: Marland Kitchen)  157/63  (!) 132/56   Pulse: 90  97   Resp: 16  18   Temp: 98.4 F (36.9 C)  99.5 F (37.5 C)   TempSrc: Oral  Oral   SpO2: (!) 80% 92% (!) 83% 92%  Weight:      Height:        Intake/Output Summary (Last 24 hours) at 08/23/2017 1304 Last data filed at 08/23/2017 1021 Gross per 24 hour  Intake 1080 ml  Output -  Net 1080 ml   Filed Weights   08/21/17 1855  Weight: 59.9 kg (132 lb)    Exam  General: Well developed, well nourished, NAD, appears stated age  HEENT: NCAT, mucous membranes moist.   Cardiovascular: S1 S2 auscultated, no rubs, murmurs or gallops. Regular rate and rhythm.  Respiratory: Diminished breath sounds, no wheezing   Abdomen: Soft, nontender, nondistended, + bowel sounds  Extremities: warm dry without cyanosis clubbing or edema  Neuro: AAOx3, nonfocal  Psych: Appropriate   Data Reviewed: I have personally reviewed following labs and imaging studies  CBC: Recent Labs  Lab 08/21/17 1838 08/23/17 0503  WBC 17.0* 15.6*  HGB 15.1 13.6  HCT 45.3 41.8  MCV 97.4 99.1  PLT 331 323   Basic Metabolic Panel: Recent Labs  Lab 08/21/17 1838 08/23/17 0503  NA 138 137  K 4.2 5.1  CL 99* 99*  CO2 27 29  GLUCOSE 152* 195*  BUN 21* 24*  CREATININE 0.83 0.91  CALCIUM 9.2 9.2   GFR: Estimated Creatinine Clearance: 67.7 mL/min (by C-G formula based on SCr of 0.91 mg/dL). Liver Function Tests: No results for input(s): AST, ALT, ALKPHOS, BILITOT, PROT, ALBUMIN in the last 168 hours. No results for input(s): LIPASE, AMYLASE in the last 168 hours. No results for input(s): AMMONIA in the last 168 hours. Coagulation Profile: No results for input(s): INR, PROTIME in the last 168 hours. Cardiac Enzymes: Recent Labs  Lab 08/22/17 0153 08/22/17 0542 08/22/17 1226  TROPONINI <0.03 <0.03 <0.03   BNP (last 3 results) No results for input(s): PROBNP in the last 8760 hours. HbA1C: Recent Labs    08/22/17 0534   HGBA1C 6.5*   CBG: No results for input(s): GLUCAP in the last 168 hours. Lipid Profile: Recent Labs    08/22/17 0534  CHOL 165  HDL 58  LDLCALC 95  TRIG 61  CHOLHDL 2.8   Thyroid Function Tests: No results for input(s): TSH, T4TOTAL, FREET4, T3FREE, THYROIDAB in the last 72 hours. Anemia Panel: No results for input(s): VITAMINB12, FOLATE, FERRITIN, TIBC, IRON, RETICCTPCT in the last 72 hours. Urine analysis:    Component Value Date/Time   COLORURINE YELLOW 08/22/2017 0205   APPEARANCEUR CLEAR 08/22/2017 0205   LABSPEC >1.046 (H) 08/22/2017 0205   PHURINE 5.0 08/22/2017 0205   GLUCOSEU NEGATIVE 08/22/2017 0205   HGBUR NEGATIVE 08/22/2017 0205   BILIRUBINUR NEGATIVE 08/22/2017 0205   KETONESUR NEGATIVE 08/22/2017 0205   PROTEINUR NEGATIVE 08/22/2017 0205   NITRITE NEGATIVE 08/22/2017 0205   LEUKOCYTESUR NEGATIVE 08/22/2017 0205   Sepsis Labs: @LABRCNTIP (procalcitonin:4,lacticidven:4)  ) Recent Results (from the past 240 hour(s))  Respiratory Panel by PCR     Status: None   Collection Time: 08/22/17 11:01 AM  Result Value Ref Range Status   Adenovirus NOT DETECTED NOT DETECTED Final   Coronavirus 229E NOT DETECTED NOT DETECTED Final   Coronavirus HKU1 NOT DETECTED NOT DETECTED Final   Coronavirus NL63 NOT DETECTED NOT DETECTED Final   Coronavirus OC43 NOT DETECTED NOT DETECTED Final  Metapneumovirus NOT DETECTED NOT DETECTED Final   Rhinovirus / Enterovirus NOT DETECTED NOT DETECTED Final   Influenza A NOT DETECTED NOT DETECTED Final   Influenza B NOT DETECTED NOT DETECTED Final   Parainfluenza Virus 1 NOT DETECTED NOT DETECTED Final   Parainfluenza Virus 2 NOT DETECTED NOT DETECTED Final   Parainfluenza Virus 3 NOT DETECTED NOT DETECTED Final   Parainfluenza Virus 4 NOT DETECTED NOT DETECTED Final   Respiratory Syncytial Virus NOT DETECTED NOT DETECTED Final   Bordetella pertussis NOT DETECTED NOT DETECTED Final   Chlamydophila pneumoniae NOT DETECTED NOT  DETECTED Final   Mycoplasma pneumoniae NOT DETECTED NOT DETECTED Final      Radiology Studies: Dg Chest 2 View  Result Date: 08/21/2017 CLINICAL DATA:  Center and right lumbar pain after lifting heavy break drum. EXAM: CHEST  2 VIEW COMPARISON:  January 23, 2017 FINDINGS: The mediastinal contour is normal. The heart size is mildly enlarged. The lungs are hyperinflated. Mild atelectasis of left lung base is noted. There is no focal pneumonia or pleural effusion. The visualized skeletal structures are unremarkable. IMPRESSION: No active cardiopulmonary disease.  Emphysema. Electronically Signed   By: Sherian Rein M.D.   On: 08/21/2017 20:12   Dg Lumbar Spine Complete  Result Date: 08/21/2017 CLINICAL DATA:  Back pain after lifting heavy break drum. EXAM: LUMBAR SPINE - COMPLETE 4+ VIEW COMPARISON:  Abdomen x-ray November 14, 2013 CT abdomen and pelvis November 12, 2013 FINDINGS: There is compression deformity of L3 vertebral body with question mild cortical irregularity of the superior endplate, acute compression fracture is not excluded. There is no malalignment. Degenerative joint changes of the spine are noted. IMPRESSION: Mild compression deformity of L3 vertebral body with question mild cortical irregularity of the superior endplate, acute compression fracture is not excluded. This finding is new compared to prior films from 2015. Electronically Signed   By: Sherian Rein M.D.   On: 08/21/2017 20:14   Ct Angio Chest Pe W And/or Wo Contrast  Result Date: 08/21/2017 CLINICAL DATA:  Back injury after lifting heavy object today. History of bronchitis and psoriasis. EXAM: CT ANGIOGRAPHY CHEST WITH CONTRAST TECHNIQUE: Multidetector CT imaging of the chest was performed using the standard protocol during bolus administration of intravenous contrast. Multiplanar CT image reconstructions and MIPs were obtained to evaluate the vascular anatomy. CONTRAST:  47 cc ISOVUE-370 IOPAMIDOL (ISOVUE-370) INJECTION 76%  COMPARISON:  Chest radiograph August 21, 2017 and CT chest August 21, 2016 FINDINGS: CARDIOVASCULAR: Adequate contrast opacification of the pulmonary artery's. Main pulmonary artery is not enlarged. No pulmonary arterial filling defects to the level of the subsegmental branches. Heart size is mildly enlarged, no right heart strain. Trace coronary artery calcifications. Mild pericardial wall thickening. Thoracic aorta is normal course and caliber, mild calcific atherosclerosis aortic arch. MEDIASTINUM/NODES: No lymphadenopathy by CT size criteria. LUNGS/PLEURA: Tracheobronchial tree is patent, no pneumothorax. Mild bronchial wall thickening. Marked biapical bullous changes with severe centrilobular emphysema. Bibasilar dependent atelectasis. UPPER ABDOMEN: Status post splenectomy with splenosis. Multiple surgical clips in upper abdomen. Colonic intra positioning. MUSCULOSKELETAL: Old moderate to severe T7 compression fracture with 50-75% height loss. Osteopenia. Multilevel Schmorl's nodes. Review of the MIP images confirms the above findings. IMPRESSION: 1. No acute pulmonary embolism. 2. Severe emphysema. Bronchial wall thickening seen with reactive airway disease and bronchitis. No pneumonia. Aortic Atherosclerosis (ICD10-I70.0) and Emphysema (ICD10-J43.9). Electronically Signed   By: Awilda Metro M.D.   On: 08/21/2017 22:29   Ct Lumbar Spine Wo Contrast  Result Date:  08/21/2017 CLINICAL DATA:  Lifting injury. Back pain. Abnormal lumbar spine radiographs showing and L3 compression fracture. EXAM: CT LUMBAR SPINE WITHOUT CONTRAST TECHNIQUE: Multidetector CT imaging of the lumbar spine was performed without intravenous contrast administration. Multiplanar CT image reconstructions were also generated. COMPARISON:  CT abdomen/pelvis 11/12/2013 FINDINGS: Segmentation: There are five lumbar type vertebral bodies. The last full intervertebral disc space is labeled L5-S1. This correlates with the radiographs.  Alignment: Normal Vertebrae: Acute compression fracture of L3 with mild retropulsion but no significant canal stenosis. No involvement of the pedicles or posterior elements. The other lumbar vertebral bodies are maintained. There is moderate age advanced osteoporosis. Paraspinal and other soft tissues: No significant findings. Advanced atherosclerotic calcifications involving the aorta and branch vessel ostia. Disc levels: No significant disc protrusions, spinal or foraminal stenosis IMPRESSION: Acute L3 compression fracture with mild retropulsion (approximately 5.5 mm) and mild spinal canal stenosis. Electronically Signed   By: Rudie MeyerP.  Gallerani M.D.   On: 08/21/2017 21:53     Scheduled Meds: . aspirin EC  81 mg Oral Daily  . azithromycin  250 mg Oral Daily  . carvedilol  6.25 mg Oral BID  . clopidogrel  75 mg Oral Daily  . enoxaparin (LOVENOX) injection  40 mg Subcutaneous Q24H  . ipratropium  0.5 mg Nebulization BID  . levalbuterol  1.25 mg Nebulization BID  . lisinopril  20 mg Oral Daily  . mouth rinse  15 mL Mouth Rinse BID  . methylPREDNISolone (SOLU-MEDROL) injection  125 mg Intravenous Q12H  . nicotine  21 mg Transdermal Daily  . pantoprazole  40 mg Oral Daily  . pravastatin  20 mg Oral QPM  . spironolactone  25 mg Oral Daily   Continuous Infusions:   LOS: 1 day   Time Spent in minutes   30 minutes  Gabriel Gibbs D.O. on 08/23/2017 at 1:04 PM  Between 7am to 7pm - Pager - [phone]  After 7pm go to [email or web address] - password [email or web address]  And look for the night coverage person covering for me after hours  [facility] Office  [phone]

## 2017-08-23 NOTE — Progress Notes (Addendum)
Occupational Therapy Evaluation Patient Details Name: Gabriel Gibbs MRN: [id number] DOB: 11-01-1950 Today's Date: 08/23/2017    History of Present Illness 67 y.o. male with medical history significant of hypertension, hyperlipidemia, tobacco abuse, possible COPD, on 2 L nasal cannula oxygen, GERD, psoriasis, tobacco abuse, chronic back pain and neck pain, CAD, s/p of stent placement, sCHF. Sustained a L3 compression fracture with minimal retropulsion after lifting a heavy drum.   Clinical Impression   PTA, pt lived alone and was modified independent with ADL and mobility @ cane level. Attempted to educate pt on compensatory techniques to reduce pain with ADL and reduce risk of falls. PT would benefit form shower seat and use of reacher @ house. O2 Sats 75 on RA after ADL and mobility and increased to 86 on 2L. Pt states "that's what it usually is at home". Pt most likely needs to wear his O2 at all times. Will attempt to see pt again to complete education. prior to DC.     Follow Up Recommendations  No OT follow up;Supervision - Intermittent    Equipment Recommendations  Tub/shower seat(pt declines)    Recommendations for Other Services       Precautions / Restrictions Precautions Precautions: Back Precaution Booklet Issued: Yes (comment)(Given to pt although he said he would throw it in the trash) Precaution Comments: pt aware of back precautions due to previous lumbar surgery and keeps at basline Required Braces or Orthoses: Spinal Brace Spinal Brace: Thoracolumbosacral orthotic;Applied in sitting position(on when upright per note; off for shower) Restrictions Weight Bearing Restrictions: No      Mobility Bed Mobility Overal bed mobility: Needs Assistance Bed Mobility: Sit to Supine       Sit to supine: Supervision   General bed mobility comments: Pt not following log rolling technique  Transfers Overall transfer level: Needs assistance Equipment used: Rolling walker  (2 wheeled);None Transfers: Sit to/from Stand Sit to Stand: Min guard         General transfer comment: close guarding due to unsteadiness; LOB x 1 - pt able to recover    Balance Overall balance assessment: Needs assistance Sitting-balance support: Single extremity supported Sitting balance-Leahy Scale: Good     Standing balance support: No upper extremity supported Standing balance-Leahy Scale: Fair                             ADL either performed or assessed with clinical judgement   ADL Overall ADL's : Needs assistance/impaired Eating/Feeding: Independent   Grooming: Modified independent   Upper Body Bathing: Supervision/ safety;Sitting   Lower Body Bathing: Min guard;Sit to/from stand;Cueing for safety;Cueing for compensatory techniques;Cueing for back precautions   Upper Body Dressing : Supervision/safety;Set up;Sitting Upper Body Dressing Details (indicate cue type and reason): Pt required A to doff brace Lower Body Dressing: Min guard;Sit to/from stand   Toilet Transfer: Ambulation;Supervision/safety   Toileting- Architect and Hygiene: Supervision/safety;Sit to/from stand       Functional mobility during ADLs: Supervision/safety;Cane General ADL Comments: Pt staes he "knows what to do". When asked to give back precuations, pt unable to verbalize precautions; Educated on back precautions for ADL and pot stated that "he knew what to do", although did not follow precautions during session; Began educaiton on reducing risk of falls, again pt staes he "knew what to do ". Educated that pt needs to wera brace when OOB, pt stated, "that's not what the doctor told me". Educated pt that  the note states to wear brace when "upright" and he may remove brace for shoewering. If pt has further questions regarding brace, he can discuss those with Dr. [name] or his PA. REcommend pt use a shower seat to bath and purchase a reacher for home use.   Pt seen for  additional session this pm per pt request to further educate him on fitting the brace appropriately. Completed education. Pt continues to require vc to properly follow back precautions. Recommend pt use his cane when ambulating. Pt states that is he uses his cane, he does not "have to use his back brace"     Vision Baseline Vision/History: Wears glasses Wears Glasses: At all times       Perception     Praxis      Pertinent Vitals/Pain Pain Assessment: 0-10 Pain Score: 9  Faces Pain Scale: Hurts even more Pain Location: back Pain Descriptors / Indicators: Aching Pain Intervention(s): Limited activity within patient's tolerance     Hand Dominance Right   Extremity/Trunk Assessment Upper Extremity Assessment Upper Extremity Assessment: LUE deficits/detail LUE Deficits / Details: reports that he has LUE weakness and pain since cervical fusion some years back.    Lower Extremity Assessment Lower Extremity Assessment: Defer to PT evaluation   Cervical / Trunk Assessment Cervical / Trunk Assessment: Kyphotic   Communication Communication Communication: No difficulties   Cognition Arousal/Alertness: Awake/alert Behavior During Therapy: Flat affect;Agitated(agitated at times) Overall Cognitive Status: No family/caregiver present to determine baseline cognitive functioning                                 General Comments: decreased safety awareness; doe snot follow back precautions   General Comments       Exercises     Shoulder Instructions      Home Living Family/patient expects to be discharged to:: Private residence Living Arrangements: Alone Available Help at Discharge: Family;Available PRN/intermittently Type of Home: House Home Access: Stairs to enter Entergy CorporationEntrance Stairs-Number of Steps: 20-25 Entrance Stairs-Rails: Right;Left;Can reach both Home Layout: One level     Bathroom Shower/Tub: IT trainerTub/shower unit;Curtain   Bathroom Toilet:  Standard Bathroom Accessibility: Yes How Accessible: Accessible via walker Home Equipment: Cane - single point   Additional Comments: pt's mother lives next door and she still drives and is independent and can help him with shopping, etc. He has access to RW.       Prior Functioning/Environment Level of Independence: Independent with assistive device(s)        Comments: SPC on occasion        OT Problem List: Decreased activity tolerance;Impaired balance (sitting and/or standing);Decreased safety awareness;Decreased knowledge of use of DME or AE;Decreased knowledge of precautions;Pain      OT Treatment/Interventions: Self-care/ADL training;DME and/or AE instruction;Therapeutic activities;Patient/family education    OT Goals(Current goals can be found in the care plan section) Acute Rehab OT Goals Patient Stated Goal: return home OT Goal Formulation: With patient Time For Goal Achievement: 09/06/17 Potential to Achieve Goals: Good  OT Frequency: Min 2X/week   Barriers to D/C:            Co-evaluation              AM-PAC PT "6 Clicks" Daily Activity     Outcome Measure Help from another person eating meals?: None Help from another person taking care of personal grooming?: A Little Help from another person toileting, which includes using toliet,  bedpan, or urinal?: A Little Help from another person bathing (including washing, rinsing, drying)?: A Little Help from another person to put on and taking off regular upper body clothing?: A Little Help from another person to put on and taking off regular lower body clothing?: A Little 6 Click Score: 19   End of Session Equipment Utilized During Treatment: Back brace Nurse Communication: Mobility status  Activity Tolerance: Patient tolerated treatment well Patient left: with call bell/phone within reach;in bed(Pt refused to have bed alarm set)  OT Visit Diagnosis: Unsteadiness on feet (R26.81);Pain Pain - part of body:  (back)                Time: 9147-8295 OT Time Calculation (min): 28 min  2nd visit 1632 - 1646 Time:13 min Charges:  OT General Charges $OT Visit: 2 Visits OT Evaluation $OT Eval Low Complexity: 1 Low OT Treatments $Self Care/Home Management : 27-37 mins G-Codes:     [facility], OT/L  [phone] 08/23/2017  Sully Manzi,HILLARY 08/23/2017, 10:25 AM

## 2017-08-24 LAB — BASIC METABOLIC PANEL
Anion gap: 9 (ref 5–15)
BUN: 22 mg/dL — ABNORMAL HIGH (ref 6–20)
CHLORIDE: 98 mmol/L — AB (ref 101–111)
CO2: 29 mmol/L (ref 22–32)
CREATININE: 0.89 mg/dL (ref 0.61–1.24)
Calcium: 9.2 mg/dL (ref 8.9–10.3)
GFR calc non Af Amer: >60 mL/min (ref >60)
Glucose, Bld: 146 mg/dL — ABNORMAL HIGH (ref 65–99)
Potassium: 4.4 mmol/L (ref 3.5–5.1)
Sodium: 136 mmol/L (ref 135–145)

## 2017-08-24 LAB — MAGNESIUM: Magnesium: 2 mg/dL (ref 1.7–2.4)

## 2017-08-24 MED ORDER — PREDNISONE 10 MG PO TABS: ORAL_TABLET | ORAL | 0 refills | Status: DC

## 2017-08-24 MED ORDER — BENZONATATE 100 MG PO CAPS: 100.0000 mg | ORAL_CAPSULE | Freq: Three times a day (TID) | ORAL | 0 refills | Status: DC | PRN

## 2017-08-24 MED ORDER — CYCLOBENZAPRINE HCL 10 MG PO TABS: 10.0000 mg | ORAL_TABLET | Freq: Three times a day (TID) | ORAL | 0 refills | Status: DC | PRN

## 2017-08-24 MED ORDER — DM-GUAIFENESIN ER 30-600 MG PO TB12: 1.0000 | ORAL_TABLET | Freq: Two times a day (BID) | ORAL | 0 refills | Status: DC | PRN

## 2017-08-24 MED ORDER — NICOTINE 21 MG/24HR TD PT24: 21.0000 mg | MEDICATED_PATCH | Freq: Every day | TRANSDERMAL | 0 refills | Status: DC

## 2017-08-24 MED ORDER — AZITHROMYCIN 250 MG PO TABS: 250.0000 mg | ORAL_TABLET | Freq: Every day | ORAL | 0 refills | Status: DC

## 2017-08-24 NOTE — Discharge Summary (Signed)
Physician Discharge Summary  Gabriel Gibbs ZOX:[id number] DOB: November 28, 1950 DOA: 08/21/2017  PCP: Quitman Livings, MD  Admit date: 08/21/2017 Discharge date: 08/24/2017  Time spent: 45 minutes  Recommendations for Outpatient Follow-up:  Patient will be discharged to home.  Patient will need to follow up with primary care provider within one week of discharge.  Follow up with Dr. Wynetta Emery, neurosurgery. Patient should continue medications as prescribed.  Patient should follow a heart healthy diet.   Discharge Diagnoses:  Acute on chronic respiratory failure with hypoxia/ COPD exacerbation SIRS secondary to possible Upper respiratory infection Back pain/L3 compression fracture Chest pain/ history of CAD Chronic systolic CHF Essential hypertension Hyperlipidemia GERD Tobacco abuse  Discharge Condition: Stable   Diet recommendation: heart healthy  Filed Weights   08/21/17 1855  Weight: 59.9 kg (132 lb)    History of present illness:  on 08/22/2017 by Dr. Serita Grammes D Trotteris a 66 y.o.malewith medical history significant ofhypertension, hyperlipidemia, tobacco abuse, possible COPD, on 2 L nasal cannula oxygen, GERD, psoriasis, tobacco abuse, chronic back painandneck pain, CAD, s/p ofstent placement,sCHF (EF 30% by L cardiac cath on 01/26/17),who presents with back pain, shortness of breath, cough and chest pain.  Patient states that he was lifting something heavy on his tractor-trailer when he suddenly felt severe sharp pain in his lower backthis AM. He states he felt a pop.The pain is constant, sharp, severe, radiating to both thighs, tingliness in both thighs, no lossofcontrolofbowel movement or bladder. It isaggravated by movement.Healso reports cough and shortness breath which has been going on for 2 days. He coughs up white mucus, no fever or chills. Denies runny nose or sore throat. Patient states that he has chest pain, which is located in the upper chest,  constant, dull, 2out of 10 in severity, nonradiating. Denies symptoms of UTI or unilateral weakness.  Hospital Course:  Acute on chronic respiratory failure with hypoxia/ COPD exacerbation -Patient states he's had a productive cough with upper respiratory type symptoms for the past month -Continues to smoke -patient uses 2L of home oxygen when sleeping -was placed on nebulizer treatments, azithromycin, Solu-Medrol -Influenza PCR and respiratory viral panel unremarkable  -Continue antitussives  -Will discharge with continuation of azithromycin, prednisone taper -Continue home nebulizer treatments  SIRS secondary to possible Upper respiratory infection -Presents with tachycardia and tachypnea, leukocytosis and elevated lactic acid -Chest x-ray reviewed: unremarkable for infection,however showed emphysema -UA unremarkable -Continue azithromycin -lactic acidosis resolved  Back pain/L3 compression fracture -Noted on CT -Neurosurgery consulted and appreciated recommended outpatient follow-up with Dr. Wynetta Emery. Currently fracture is nonoperative. TLSO brace to be placed.  -Continue pain control -PT evaluated patient, no further therapy needs  Chest pain/ history of CAD -states it is across his chest and worsens with coughing -will continue to cycle troponins-currently negative 3 -echocardiogram  EF 40-45, grade 1 diastolic dysfunction -Continue pain control, aspirin, Plavix, Coreg, statin  NSVT -supposedly patient had 18 run of VT overnight and was asymptomatic -K 4.4, Mag 2  Chronic systolic CHF -Patient had cardiac catheterization done at outside facility(01/27/2107)showing an EF of 30% -Continue to monitor intake and output, daily weights -Continue Coreg, lisinopril, spironolactone -Echocardiogram EF 40-45, grade 1 diastolic dysfunction  Essential hypertension -Continue Coreg, lisinopril, hydralazine as needed  Hyperlipidemia -Continue statin  GERD -Continue  PPI  Tobacco abuse -Discussed smoking cessation -Continue nicotine patch  Procedures: Echocardiogram  Consultations: Nuerosurgery  Discharge Exam: Vitals:   08/24/17 0825 08/24/17 0940  BP:  (!) 127/56  Pulse:  Resp:    Temp:    SpO2: 96%      General: Well developed, well nourished, NAD, appears stated age  HEENT: NCAT, mucous membranes moist.  Cardiovascular: S1 S2 auscultated, RRR, no murmur  Respiratory: Diminished breath sounds, no wheezing   Abdomen: Soft, nontender, nondistended, + bowel sounds  Extremities: warm dry without cyanosis clubbing or edema  Neuro: AAOx3, Nonfocal  Psych: Appropriate and pleasant  Discharge Instructions Discharge Instructions    Discharge instructions   Complete by:  As directed    Patient will be discharged to home.  Patient will need to follow up with primary care provider within one week of discharge.  Follow up with Dr. Wynetta Emery, neurosurgery. Patient should continue medications as prescribed.  Patient should follow a heart healthy diet.     Allergies as of 08/24/2017   No Known Allergies     Medication List    STOP taking these medications   oxyCODONE 5 MG immediate release tablet Commonly known as:  ROXICODONE     TAKE these medications   albuterol 1.25 MG/3ML nebulizer solution Commonly known as:  ACCUNEB Take 2 ampules by nebulization daily.   albuterol 108 (90 Base) MCG/ACT inhaler Commonly known as:  PROVENTIL HFA;VENTOLIN HFA Inhale 2 puffs into the lungs 4 (four) times daily as needed for wheezing.   aspirin EC 81 MG tablet Take 81 mg by mouth daily.   azithromycin 250 MG tablet Commonly known as:  ZITHROMAX Take 1 tablet (250 mg total) by mouth daily. Continuation of hospital course. Start taking on:  08/25/2017   benzonatate 100 MG capsule Commonly known as:  TESSALON Take 1 capsule (100 mg total) by mouth 3 (three) times daily as needed for cough.   carisoprodol 350 MG tablet Commonly known  as:  SOMA Take 350 mg by mouth 3 (three) times daily as needed for muscle spasms.   carvedilol 6.25 MG tablet Commonly known as:  COREG Take 6.25 mg by mouth 2 (two) times daily.   clopidogrel 75 MG tablet Commonly known as:  PLAVIX Take 75 mg by mouth daily.   cyclobenzaprine 10 MG tablet Commonly known as:  FLEXERIL Take 1 tablet (10 mg total) by mouth 3 (three) times daily as needed for muscle spasms.   dextromethorphan-guaiFENesin 30-600 MG 12hr tablet Commonly known as:  MUCINEX DM Take 1 tablet by mouth 2 (two) times daily as needed for cough.   lisinopril 20 MG tablet Commonly known as:  PRINIVIL,ZESTRIL Take 20 mg by mouth daily.   nicotine 21 mg/24hr patch Commonly known as:  NICODERM CQ - dosed in mg/24 hours Place 1 patch (21 mg total) onto the skin daily. Start taking on:  08/25/2017   oxyCODONE-acetaminophen 10-325 MG tablet Commonly known as:  PERCOCET Take 1 tablet by mouth 3 (three) times daily as needed for pain.   pantoprazole 40 MG tablet Commonly known as:  PROTONIX Take 40 mg by mouth daily.   pravastatin 20 MG tablet Commonly known as:  PRAVACHOL Take 20 mg by mouth every evening.   predniSONE 10 MG tablet Commonly known as:  DELTASONE Take 4 tabs x 3 days, then 3 tabs x 3 days, then 2 tabs x 3days, then 1 tab x 3days   spironolactone 25 MG tablet Commonly known as:  ALDACTONE Take 25 mg by mouth daily.      No Known Allergies Follow-up Information    Quitman Livings, MD. Schedule an appointment as soon as possible for a visit in 1  week(s).   Specialty:  Internal Medicine Why:  Hospital follow up Contact information: [address] [place] [address] [phone]        Donalee Citrin, MD. Schedule an appointment as soon as possible for a visit in 2 week(s).   Specialty:  Neurosurgery Why:  Hospital follow up Contact information: [address] [place] [address] [phone]            The results  of significant diagnostics from this hospitalization (including imaging, microbiology, ancillary and laboratory) are listed below for reference.    Significant Diagnostic Studies: Dg Chest 2 View  Result Date: 08/21/2017 CLINICAL DATA:  Center and right lumbar pain after lifting heavy break drum. EXAM: CHEST  2 VIEW COMPARISON:  January 23, 2017 FINDINGS: The mediastinal contour is normal. The heart size is mildly enlarged. The lungs are hyperinflated. Mild atelectasis of left lung base is noted. There is no focal pneumonia or pleural effusion. The visualized skeletal structures are unremarkable. IMPRESSION: No active cardiopulmonary disease.  Emphysema. Electronically Signed   By: Sherian Rein M.D.   On: 08/21/2017 20:12   Dg Lumbar Spine Complete  Result Date: 08/21/2017 CLINICAL DATA:  Back pain after lifting heavy break drum. EXAM: LUMBAR SPINE - COMPLETE 4+ VIEW COMPARISON:  Abdomen x-ray November 14, 2013 CT abdomen and pelvis November 12, 2013 FINDINGS: There is compression deformity of L3 vertebral body with question mild cortical irregularity of the superior endplate, acute compression fracture is not excluded. There is no malalignment. Degenerative joint changes of the spine are noted. IMPRESSION: Mild compression deformity of L3 vertebral body with question mild cortical irregularity of the superior endplate, acute compression fracture is not excluded. This finding is new compared to prior films from 2015. Electronically Signed   By: Sherian Rein M.D.   On: 08/21/2017 20:14   Ct Angio Chest Pe W And/or Wo Contrast  Result Date: 08/21/2017 CLINICAL DATA:  Back injury after lifting heavy object today. History of bronchitis and psoriasis. EXAM: CT ANGIOGRAPHY CHEST WITH CONTRAST TECHNIQUE: Multidetector CT imaging of the chest was performed using the standard protocol during bolus administration of intravenous contrast. Multiplanar CT image reconstructions and MIPs were obtained to evaluate the vascular  anatomy. CONTRAST:  47 cc ISOVUE-370 IOPAMIDOL (ISOVUE-370) INJECTION 76% COMPARISON:  Chest radiograph August 21, 2017 and CT chest August 21, 2016 FINDINGS: CARDIOVASCULAR: Adequate contrast opacification of the pulmonary artery's. Main pulmonary artery is not enlarged. No pulmonary arterial filling defects to the level of the subsegmental branches. Heart size is mildly enlarged, no right heart strain. Trace coronary artery calcifications. Mild pericardial wall thickening. Thoracic aorta is normal course and caliber, mild calcific atherosclerosis aortic arch. MEDIASTINUM/NODES: No lymphadenopathy by CT size criteria. LUNGS/PLEURA: Tracheobronchial tree is patent, no pneumothorax. Mild bronchial wall thickening. Marked biapical bullous changes with severe centrilobular emphysema. Bibasilar dependent atelectasis. UPPER ABDOMEN: Status post splenectomy with splenosis. Multiple surgical clips in upper abdomen. Colonic intra positioning. MUSCULOSKELETAL: Old moderate to severe T7 compression fracture with 50-75% height loss. Osteopenia. Multilevel Schmorl's nodes. Review of the MIP images confirms the above findings. IMPRESSION: 1. No acute pulmonary embolism. 2. Severe emphysema. Bronchial wall thickening seen with reactive airway disease and bronchitis. No pneumonia. Aortic Atherosclerosis (ICD10-I70.0) and Emphysema (ICD10-J43.9). Electronically Signed   By: Awilda Metro M.D.   On: 08/21/2017 22:29   Ct Lumbar Spine Wo Contrast  Result Date: 08/21/2017 CLINICAL DATA:  Lifting injury. Back pain. Abnormal lumbar spine radiographs showing and  L3 compression fracture. EXAM: CT LUMBAR SPINE WITHOUT CONTRAST TECHNIQUE: Multidetector CT imaging of the lumbar spine was performed without intravenous contrast administration. Multiplanar CT image reconstructions were also generated. COMPARISON:  CT abdomen/pelvis 11/12/2013 FINDINGS: Segmentation: There are five lumbar type vertebral bodies. The last full  intervertebral disc space is labeled L5-S1. This correlates with the radiographs. Alignment: Normal Vertebrae: Acute compression fracture of L3 with mild retropulsion but no significant canal stenosis. No involvement of the pedicles or posterior elements. The other lumbar vertebral bodies are maintained. There is moderate age advanced osteoporosis. Paraspinal and other soft tissues: No significant findings. Advanced atherosclerotic calcifications involving the aorta and branch vessel ostia. Disc levels: No significant disc protrusions, spinal or foraminal stenosis IMPRESSION: Acute L3 compression fracture with mild retropulsion (approximately 5.5 mm) and mild spinal canal stenosis. Electronically Signed   By: Rudie MeyerP.  Gallerani M.D.   On: 08/21/2017 21:53    Microbiology: Recent Results (from the past 240 hour(s))  Culture, blood (routine x 2) Call MD if unable to obtain prior to antibiotics being given     Status: None (Preliminary result)   Collection Time: 08/22/17  2:00 AM  Result Value Ref Range Status   Specimen Description BLOOD RIGHT WRIST  Final   Special Requests   Final    BOTTLES DRAWN AEROBIC AND ANAEROBIC Blood Culture adequate volume   Culture NO GROWTH 2 DAYS  Final   Report Status PENDING  Incomplete  Culture, blood (routine x 2) Call MD if unable to obtain prior to antibiotics being given     Status: None (Preliminary result)   Collection Time: 08/22/17  2:04 AM  Result Value Ref Range Status   Specimen Description BLOOD LEFT HAND  Final   Special Requests   Final    BOTTLES DRAWN AEROBIC AND ANAEROBIC Blood Culture adequate volume   Culture NO GROWTH 2 DAYS  Final   Report Status PENDING  Incomplete  Respiratory Panel by PCR     Status: None   Collection Time: 08/22/17 11:01 AM  Result Value Ref Range Status   Adenovirus NOT DETECTED NOT DETECTED Final   Coronavirus 229E NOT DETECTED NOT DETECTED Final   Coronavirus HKU1 NOT DETECTED NOT DETECTED Final   Coronavirus NL63 NOT  DETECTED NOT DETECTED Final   Coronavirus OC43 NOT DETECTED NOT DETECTED Final   Metapneumovirus NOT DETECTED NOT DETECTED Final   Rhinovirus / Enterovirus NOT DETECTED NOT DETECTED Final   Influenza A NOT DETECTED NOT DETECTED Final   Influenza B NOT DETECTED NOT DETECTED Final   Parainfluenza Virus 1 NOT DETECTED NOT DETECTED Final   Parainfluenza Virus 2 NOT DETECTED NOT DETECTED Final   Parainfluenza Virus 3 NOT DETECTED NOT DETECTED Final   Parainfluenza Virus 4 NOT DETECTED NOT DETECTED Final   Respiratory Syncytial Virus NOT DETECTED NOT DETECTED Final   Bordetella pertussis NOT DETECTED NOT DETECTED Final   Chlamydophila pneumoniae NOT DETECTED NOT DETECTED Final   Mycoplasma pneumoniae NOT DETECTED NOT DETECTED Final     Labs: Basic Metabolic Panel: Recent Labs  Lab 08/21/17 1838 08/23/17 0503 08/24/17 0922  NA 138 137 136  K 4.2 5.1 4.4  CL 99* 99* 98*  CO2 27 29 29   GLUCOSE 152* 195* 146*  BUN 21* 24* 22*  CREATININE 0.83 0.91 0.89  CALCIUM 9.2 9.2 9.2  MG  --   --  2.0   Liver Function Tests: No results for input(s): AST, ALT, ALKPHOS, BILITOT, PROT, ALBUMIN in the last 168 hours.  No results for input(s): LIPASE, AMYLASE in the last 168 hours. No results for input(s): AMMONIA in the last 168 hours. CBC: Recent Labs  Lab 08/21/17 1838 08/23/17 0503  WBC 17.0* 15.6*  HGB 15.1 13.6  HCT 45.3 41.8  MCV 97.4 99.1  PLT 331 323   Cardiac Enzymes: Recent Labs  Lab 08/22/17 0153 08/22/17 0542 08/22/17 1226  TROPONINI <0.03 <0.03 <0.03   BNP: BNP (last 3 results) No results for input(s): BNP in the last 8760 hours.  ProBNP (last 3 results) No results for input(s): PROBNP in the last 8760 hours.  CBG: No results for input(s): GLUCAP in the last 168 hours.     Signed:  [name]  [facility] 08/24/2017, 12:10 PM

## 2017-08-24 NOTE — Progress Notes (Signed)
Occupational Therapy Treatment Patient Details Name: Gabriel Gibbs MRN: [id number] DOB: July 31, 1951 Today's Date: 08/24/2017    History of present illness 67 y.o. male with medical history significant of hypertension, hyperlipidemia, tobacco abuse, possible COPD, on 2 L nasal cannula oxygen, GERD, psoriasis, tobacco abuse, chronic back pain and neck pain, CAD, s/p of stent placement, sCHF. Sustained a L3 compression fracture with minimal retropulsion after lifting a heavy drum.   OT comments  Completed education regarding compensatory techniques for ADL, reducing risk of falls and energy conservation. Discussed need to monitor O2 sats as pt desats to 75 with ambulation. Pt states that he will discuss this with his "lung doctor in February". Again reminded pt that he is to wear his back brace when he is up and OOB, with the exception of nighttime trips to the bathroom and showers. Educated nursing on use of back brace. All education completed. OT signing off at this time.   Follow Up Recommendations  No OT follow up;Supervision - Intermittent    Equipment Recommendations  None recommended by OT    Recommendations for Other Services      Precautions / Restrictions Precautions Precautions: Back Required Braces or Orthoses: Spinal Brace Spinal Brace: Thoracolumbosacral orthotic;Applied in sitting position(when OOB/upright) Restrictions Weight Bearing Restrictions: No       Mobility Bed Mobility Overal bed mobility: Needs Assistance             General bed mobility comments: s fto follow back precautions  Transfers Overall transfer level: Modified independent                    Balance             Standing balance-Leahy Scale: Fair                             ADL either performed or assessed with clinical judgement   ADL Overall ADL's : Needs assistance/impaired                                       General ADL Comments:  Reviewed compensatory techniques for ADL; Encouraged pt to use his reacher to assist with IADL tasks; discussed modifiying activities to decrease pain and reduce risk of falls - pt verbalzied understandingl; Also discussed energy conservation and need to monitor O2 and most likely waer O2 during activity as he desats to 75; Also discussed need to for him to follow up with his lung doctor regarding portable O2 - pt staes he plans to discuss this at his next visit     Vision       Perception     Praxis      Cognition Arousal/Alertness: Awake/alert Behavior During Therapy: Flat affect;Agitated Overall Cognitive Status: No family/caregiver present to determine baseline cognitive functioning                                 General Comments: decreased safety awareness; doed not follow back precautions        Exercises     Shoulder Instructions       General Comments      Pertinent Vitals/ Pain       Pain Assessment: Faces Faces Pain Scale: Hurts little more Pain Location: back Pain Descriptors / Indicators: Discomfort  Pain Intervention(s): Limited activity within patient's tolerance  Home Living                                          Prior Functioning/Environment              Frequency           Progress Toward Goals  OT Goals(current goals can now be found in the care plan section)  Progress towards OT goals: Goals met/education completed, patient discharged from OT  Acute Rehab OT Goals Patient Stated Goal: return home OT Goal Formulation: With patient Time For Goal Achievement: 09/06/17 Potential to Achieve Goals: Good ADL Goals Pt Will Perform Lower Body Bathing: with modified independence;sit to/from stand Pt Will Perform Lower Body Dressing: with modified independence;sit to/from stand Pt Will Transfer to Toilet: with modified independence;ambulating Pt Will Perform Toileting - Clothing Manipulation and hygiene: with  modified independence Pt Will Perform Tub/Shower Transfer: with modified independence;Tub transfer  Plan All goals met and education completed, patient discharged from OT services    Co-evaluation                 AM-PAC PT "6 Clicks" Daily Activity     Outcome Measure   Help from another person eating meals?: None Help from another person taking care of personal grooming?: None Help from another person toileting, which includes using toliet, bedpan, or urinal?: None Help from another person bathing (including washing, rinsing, drying)?: None Help from another person to put on and taking off regular upper body clothing?: None Help from another person to put on and taking off regular lower body clothing?: None 6 Click Score: 24    End of Session Equipment Utilized During Treatment: Back brace  OT Visit Diagnosis: Unsteadiness on feet (R26.81);Pain Pain - part of body: (back)   Activity Tolerance Patient tolerated treatment well   Patient Left in bed;with call bell/phone within reach   Nurse Communication Mobility status;Other (comment)(use of back brace when OOB ambulating in hall)        Time: 1027-2536 OT Time Calculation (min): 16 min  Charges: OT General Charges $OT Visit: 1 Visit OT Treatments $Self Care/Home Management : 8-22 mins  [facility], OT/L  [phone] 08/24/2017   Ayrabella Labombard,HILLARY 08/24/2017, 10:46 AM

## 2017-08-24 NOTE — Progress Notes (Signed)
Pt had an 18 beats of V Tach, asymptomatic, not sustained. NP on call notified. Will continue to monitor.

## 2017-08-24 NOTE — Discharge Instructions (Signed)
Smoking Tobacco Information Smoking tobacco will very likely harm your health. Tobacco contains a poisonous (toxic), colorless chemical called nicotine. Nicotine affects the brain and makes tobacco addictive. This change in your brain can make it hard to stop smoking. Tobacco also has other toxic chemicals that can hurt your body and raise your risk of many cancers. How can smoking tobacco affect me? Smoking tobacco can increase your chances of having serious health conditions, such as:  Cancer. Smoking is most commonly associated with lung cancer, but can lead to cancer in other parts of the body.  Chronic obstructive pulmonary disease (COPD). This is a long-term lung condition that makes it hard to breathe. It also gets worse over time.  High blood pressure (hypertension), heart disease, stroke, or heart attack.  Lung infections, such as pneumonia.  Cataracts. This is when the lenses in the eyes become clouded.  Digestive problems. This may include peptic ulcers, heartburn, and gastroesophageal reflux disease (GERD).  Oral health problems, such as gum disease and tooth loss.  Loss of taste and smell.  Smoking can affect your appearance by causing:  Wrinkles.  Yellow or stained teeth, fingers, and fingernails.  Smoking tobacco can also affect your social life.  Many workplaces, restaurants, hotels, and public places are tobacco-free. This means that you may experience challenges in finding places to smoke when away from home.  The cost of a smoking habit can be expensive. Expenses for someone who smokes come in two ways: ? You spend money on a regular basis to buy tobacco. ? Your health care costs in the long-term are higher if you smoke.  Tobacco smoke can also affect the health of those around you. Children of smokers have greater chances of: ? Sudden infant death syndrome (SIDS). ? Ear infections. ? Lung infections.  What lifestyle changes can be made?  Do not start  smoking. Quit if you already do.  To quit smoking: ? Make a plan to quit smoking and commit yourself to it. Look for programs to help you and ask your health care provider for recommendations and ideas. ? Talk with your health care provider about using nicotine replacement medicines to help you quit. Medicine replacement medicines include gum, lozenges, patches, sprays, or pills. ? Do not replace cigarette smoking with electronic cigarettes, which are commonly called e-cigarettes. The safety of e-cigarettes is not known, and some may contain harmful chemicals. ? Avoid places, people, or situations that tempt you to smoke. ? If you try to quit but return to smoking, don't give up hope. It is very common for people to try a number of times before they fully succeed. When you feel ready again, give it another try.  Quitting smoking might affect the way you eat as well as your weight. Be prepared to monitor your eating habits. Get support in planning and following a healthy diet.  Ask your health care provider about having regular tests (screenings) to check for cancer. This may include blood tests, imaging tests, and other tests.  Exercise regularly. Consider taking walks, joining a gym, or doing yoga or exercise classes.  Develop skills to manage your stress. These skills include meditation. What are the benefits of quitting smoking? By quitting smoking, you may:  Lower your risk of getting cancer and other diseases caused by smoking.  Live longer.  Breathe better.  Lower your blood pressure and heart rate.  Stop your addiction to tobacco.  Stop creating secondhand smoke that hurts other people.  Improve your   sense of taste and smell.  Look better over time, due to having fewer wrinkles and less staining.  What can happen if changes are not made? If you do not stop smoking, you may:  Get cancer and other diseases.  Develop COPD or other long-term (chronic) lung  conditions.  Develop serious problems with your heart and blood vessels (cardiovascular system).  Need more tests to screen for problems caused by smoking.  Have higher, long-term healthcare costs from medicines or treatments related to smoking.  Continue to have worsening changes in your lungs, mouth, and nose.  Where to find support: To get support to quit smoking, consider:  Asking your health care provider for more information and resources.  Taking classes to learn more about quitting smoking.  Looking for local organizations that offer resources about quitting smoking.  Joining a support group for people who want to quit smoking in your local community.  Where to find more information: You may find more information about quitting smoking from:  HelpGuide.org: www.helpguide.org/articles/addictions/how-to-quit-smoking.htm  Smokefree.gov: smokefree.gov  American Lung Association: www.lung.org  Contact a health care provider if:  You have problems breathing.  Your lips, nose, or fingers turn blue.  You have chest pain.  You are coughing up blood.  You feel faint or you pass out.  You have other noticeable changes that cause you to worry. Summary  Smoking tobacco can negatively affect your health, the health of those around you, your finances, and your social life.  Do not start smoking. Quit if you already do. If you need help quitting, ask your health care provider.  Think about joining a support group for people who want to quit smoking in your local community. There are many effective programs that will help you to quit this behavior. This information is not intended to replace advice given to you by your health care provider. Make sure you discuss any questions you have with your health care provider. Document Released: 08/11/2016 Document Revised: 08/11/2016 Document Reviewed: 08/11/2016 Elsevier Interactive Patient Education  2018 [employer]. Chronic  Obstructive Pulmonary Disease Chronic obstructive pulmonary disease (COPD) is a long-term (chronic) lung problem. When you have COPD, it is hard for air to get in and out of your lungs. The way your lungs work will never return to normal. Usually the condition gets worse over time. There are things you can do to keep yourself as healthy as possible. Your doctor may treat your condition with:  Medicines.  Quitting smoking, if you smoke.  Rehabilitation. This may involve a team of specialists.  Oxygen.  Exercise and changes to your diet.  Lung surgery.  Comfort measures (palliative care).  Follow these instructions at home: Medicines  Take over-the-counter and prescription medicines only as told by your doctor.  Talk to your doctor before taking any cough or allergy medicines. You may need to avoid medicines that cause your lungs to be dry. Lifestyle  If you smoke, stop. Smoking makes the problem worse. If you need help quitting, ask your doctor.  Avoid being around things that make your breathing worse. This may include smoke, chemicals, and fumes.  Stay active, but remember to also rest.  Learn and use tips on how to relax.  Make sure you get enough sleep. Most adults need at least 7 hours a night.  Eat healthy foods. Eat smaller meals more often. Rest before meals. Controlled breathing  Learn and use tips on how to control your breathing as told by your doctor. Try: ?   Breathing in (inhaling) through your nose for 1 second. Then, pucker your lips and breath out (exhale) through your lips for 2 seconds. ? Putting one hand on your belly (abdomen). Breathe in slowly through your nose for 1 second. Your hand on your belly should move out. Pucker your lips and breathe out slowly through your lips. Your hand on your belly should move in as you breathe out. Controlled coughing  Learn and use controlled coughing to clear mucus from your lungs. The steps are: 1. Lean your head a  little forward. 2. Breathe in deeply. 3. Try to hold your breath for 3 seconds. 4. Keep your mouth slightly open while coughing 2 times. 5. Spit any mucus out into a tissue. 6. Rest and do the steps again 1 or 2 times as needed. General instructions  Make sure you get all the shots (vaccines) that your doctor recommends. Ask your doctor about a flu shot and a pneumonia shot.  Use oxygen therapy and therapy to help improve your lungs (pulmonary rehabilitation) if told by your doctor. If you need home oxygen therapy, ask your doctor if you should buy a tool to measure your oxygen level (oximeter).  Make a COPD action plan with your doctor. This helps you know what to do if you feel worse than usual.  Manage any other conditions you have as told by your doctor.  Avoid going outside when it is very hot, cold, or humid.  Avoid people who have a sickness you can catch (contagious).  Keep all follow-up visits as told by your doctor. This is important. Contact a doctor if:  You cough up more mucus than usual.  There is a change in the color or thickness of the mucus.  It is harder to breathe than usual.  Your breathing is faster than usual.  You have trouble sleeping.  You need to use your medicines more often than usual.  You have trouble doing your normal activities such as getting dressed or walking around the house. Get help right away if:  You have shortness of breath while resting.  You have shortness of breath that stops you from: ? Being able to talk. ? Doing normal activities.  Your chest hurts for longer than 5 minutes.  Your skin color is more blue than usual.  Your pulse oximeter shows that you have low oxygen for longer than 5 minutes.  You have a fever.  You feel too tired to breathe normally. Summary  Chronic obstructive pulmonary disease (COPD) is a long-term lung problem.  The way your lungs work will never return to normal. Usually the condition gets  worse over time. There are things you can do to keep yourself as healthy as possible.  Take over-the-counter and prescription medicines only as told by your doctor.  If you smoke, stop. Smoking makes the problem worse. This information is not intended to replace advice given to you by your health care provider. Make sure you discuss any questions you have with your health care provider. Document Released: 01/13/2008 Document Revised: 01/02/2016 Document Reviewed: 03/23/2013 Elsevier Interactive Patient Education  2017 Elsevier Inc.  

## 2017-08-24 NOTE — Progress Notes (Signed)
Gabriel KeenWilliam D Gibbs to be D/C'd Home per MD order.  Discussed with the patient and all questions fully answered.  VSS, Skin clean, dry and intact without evidence of skin break down, no evidence of skin tears noted. IV catheter discontinued intact. Site without signs and symptoms of complications. Dressing and pressure applied.  An After Visit Summary was printed and given to the patient. Patient received prescription.  D/c education completed with patient/family including follow up instructions, medication list, d/c activities limitations if indicated, with other d/c instructions as indicated by MD - patient able to verbalize understanding, all questions fully answered.   Patient instructed to return to ED, call 911, or call MD for any changes in condition.   Patient escorted via WC, and D/C home via private auto.  Caren HazyKamila A Shacoria Latif 08/24/2017 12:22 PM

## 2017-08-27 LAB — CULTURE, BLOOD (ROUTINE X 2)
CULTURE: NO GROWTH
Culture: NO GROWTH
SPECIAL REQUESTS: ADEQUATE
Special Requests: ADEQUATE

## 2017-08-30 ENCOUNTER — Emergency Department (HOSPITAL_COMMUNITY): Payer: Medicare HMO

## 2017-08-30 ENCOUNTER — Emergency Department (HOSPITAL_COMMUNITY)
Admission: EM | Admit: 2017-08-30 | Discharge: 2017-08-31 | Disposition: A | Discharge status: Home / Self Care | Payer: Medicare HMO | Attending: Emergency Medicine | Admitting: Emergency Medicine

## 2017-08-30 ENCOUNTER — Other Ambulatory Visit: Payer: Self-pay

## 2017-08-30 ENCOUNTER — Encounter (HOSPITAL_COMMUNITY): Payer: Self-pay | Admitting: *Deleted

## 2017-08-30 DIAGNOSIS — X58XXXD Exposure to other specified factors, subsequent encounter: Secondary | ICD-10-CM | POA: Insufficient documentation

## 2017-08-30 DIAGNOSIS — Z7982 Long term (current) use of aspirin: Secondary | ICD-10-CM | POA: Diagnosis not present

## 2017-08-30 DIAGNOSIS — I110 Hypertensive heart disease with heart failure: Secondary | ICD-10-CM | POA: Insufficient documentation

## 2017-08-30 DIAGNOSIS — R0902 Hypoxemia: Secondary | ICD-10-CM | POA: Insufficient documentation

## 2017-08-30 DIAGNOSIS — J449 Chronic obstructive pulmonary disease, unspecified: Secondary | ICD-10-CM | POA: Diagnosis not present

## 2017-08-30 DIAGNOSIS — J410 Simple chronic bronchitis: Secondary | ICD-10-CM

## 2017-08-30 DIAGNOSIS — M545 Low back pain: Secondary | ICD-10-CM | POA: Diagnosis not present

## 2017-08-30 DIAGNOSIS — S32038K Other fracture of third lumbar vertebra, subsequent encounter for fracture with nonunion: Secondary | ICD-10-CM | POA: Diagnosis not present

## 2017-08-30 DIAGNOSIS — Z79899 Other long term (current) drug therapy: Secondary | ICD-10-CM | POA: Diagnosis not present

## 2017-08-30 DIAGNOSIS — F17210 Nicotine dependence, cigarettes, uncomplicated: Secondary | ICD-10-CM | POA: Diagnosis not present

## 2017-08-30 DIAGNOSIS — R0602 Shortness of breath: Secondary | ICD-10-CM | POA: Diagnosis present

## 2017-08-30 DIAGNOSIS — S32038A Other fracture of third lumbar vertebra, initial encounter for closed fracture: Secondary | ICD-10-CM

## 2017-08-30 DIAGNOSIS — I5022 Chronic systolic (congestive) heart failure: Secondary | ICD-10-CM | POA: Diagnosis not present

## 2017-08-30 DIAGNOSIS — M549 Dorsalgia, unspecified: Secondary | ICD-10-CM | POA: Diagnosis present

## 2017-08-30 LAB — COMPREHENSIVE METABOLIC PANEL
ALBUMIN: 3.5 g/dL (ref 3.5–5.0)
ALT: 20 U/L (ref 17–63)
AST: 23 U/L (ref 15–41)
Alkaline Phosphatase: 41 U/L (ref 38–126)
Anion gap: 13 (ref 5–15)
BILIRUBIN TOTAL: 0.4 mg/dL (ref 0.3–1.2)
BUN: 22 mg/dL — AB (ref 6–20)
CO2: 27 mmol/L (ref 22–32)
CREATININE: 0.88 mg/dL (ref 0.61–1.24)
Calcium: 8.9 mg/dL (ref 8.9–10.3)
Chloride: 97 mmol/L — ABNORMAL LOW (ref 101–111)
GFR calc Af Amer: >60 mL/min (ref >60)
GFR calc non Af Amer: >60 mL/min (ref >60)
Glucose, Bld: 116 mg/dL — ABNORMAL HIGH (ref 65–99)
POTASSIUM: 4.8 mmol/L (ref 3.5–5.1)
Sodium: 137 mmol/L (ref 135–145)
TOTAL PROTEIN: 5.9 g/dL — AB (ref 6.5–8.1)

## 2017-08-30 LAB — URINALYSIS, ROUTINE W REFLEX MICROSCOPIC
Bacteria, UA: NONE SEEN
Bilirubin Urine: NEGATIVE
GLUCOSE, UA: NEGATIVE mg/dL
KETONES UR: 5 mg/dL — AB
Leukocytes, UA: NEGATIVE
Nitrite: NEGATIVE
PROTEIN: NEGATIVE mg/dL
Specific Gravity, Urine: 1.018 (ref 1.005–1.030)
Squamous Epithelial / LPF: NONE SEEN
pH: 6 (ref 5.0–8.0)

## 2017-08-30 LAB — CBC WITH DIFFERENTIAL/PLATELET
BASOS PCT: 0 %
Basophils Absolute: 0 10*3/uL (ref 0.0–0.1)
Eosinophils Absolute: 0.3 10*3/uL (ref 0.0–0.7)
Eosinophils Relative: 3 %
HEMATOCRIT: 42.4 % (ref 39.0–52.0)
HEMOGLOBIN: 14.2 g/dL (ref 13.0–17.0)
Lymphocytes Relative: 24 %
Lymphs Abs: 2.5 10*3/uL (ref 0.7–4.0)
MCH: 32.8 pg (ref 26.0–34.0)
MCHC: 33.5 g/dL (ref 30.0–36.0)
MCV: 97.9 fL (ref 78.0–100.0)
Monocytes Absolute: 1.1 10*3/uL — ABNORMAL HIGH (ref 0.1–1.0)
Monocytes Relative: 11 %
NEUTROS ABS: 6.4 10*3/uL (ref 1.7–7.7)
Neutrophils Relative %: 62 %
Platelets: 379 10*3/uL (ref 150–400)
RBC: 4.33 MIL/uL (ref 4.22–5.81)
RDW: 15.7 % — ABNORMAL HIGH (ref 11.5–15.5)
WBC: 10.3 10*3/uL (ref 4.0–10.5)

## 2017-08-30 LAB — I-STAT TROPONIN, ED: Troponin i, poc: 0 ng/mL (ref 0.00–0.08)

## 2017-08-30 LAB — BRAIN NATRIURETIC PEPTIDE: B Natriuretic Peptide: 147.5 pg/mL — ABNORMAL HIGH (ref 0.0–100.0)

## 2017-08-30 MED ORDER — METHYLPREDNISOLONE SODIUM SUCC 125 MG IJ SOLR
125.0000 mg | Freq: Once | INTRAMUSCULAR | Status: AC
  Administered 2017-08-30: 125 mg via INTRAVENOUS
  Filled 2017-08-30: qty 2

## 2017-08-30 MED ORDER — FENTANYL CITRATE (PF) 100 MCG/2ML IJ SOLN
50.0000 ug | Freq: Once | INTRAMUSCULAR | Status: AC
  Administered 2017-08-30: 50 ug via INTRAVENOUS
  Filled 2017-08-30: qty 2

## 2017-08-30 MED ORDER — DIAZEPAM 5 MG/ML IJ SOLN
5.0000 mg | Freq: Once | INTRAMUSCULAR | Status: AC
  Administered 2017-08-30: 5 mg via INTRAVENOUS
  Filled 2017-08-30: qty 2

## 2017-08-30 MED ORDER — HYDROMORPHONE HCL 1 MG/ML IJ SOLN
1.0000 mg | Freq: Once | INTRAMUSCULAR | Status: AC
  Administered 2017-08-30: 1 mg via INTRAVENOUS
  Filled 2017-08-30: qty 1

## 2017-08-30 MED ORDER — IPRATROPIUM BROMIDE 0.02 % IN SOLN
0.5000 mg | Freq: Once | RESPIRATORY_TRACT | Status: AC
  Administered 2017-08-30: 0.5 mg via RESPIRATORY_TRACT
  Filled 2017-08-30: qty 2.5

## 2017-08-30 MED ORDER — LORAZEPAM 2 MG/ML IJ SOLN
1.0000 mg | Freq: Once | INTRAMUSCULAR | Status: AC
  Administered 2017-08-30: 1 mg via INTRAVENOUS
  Filled 2017-08-30: qty 1

## 2017-08-30 MED ORDER — OXYCODONE-ACETAMINOPHEN 10-325 MG PO TABS: 1.0000 | ORAL_TABLET | Freq: Three times a day (TID) | ORAL | 0 refills | Status: AC | PRN

## 2017-08-30 MED ORDER — ALBUTEROL SULFATE (2.5 MG/3ML) 0.083% IN NEBU
5.0000 mg | INHALATION_SOLUTION | Freq: Once | RESPIRATORY_TRACT | Status: AC
  Administered 2017-08-30: 5 mg via RESPIRATORY_TRACT
  Filled 2017-08-30: qty 6

## 2017-08-30 NOTE — ED Triage Notes (Signed)
Pt c/o Bil ankle swelling, pt reports oliguria, pt reports onset of symptoms since 08/24/17, pt hx of COPD, denies worsening SOB, denies CP, pt A&O x4

## 2017-08-30 NOTE — ED Notes (Signed)
Pt's O2 was noted to be 91% on RA--Pt was asked if he wore O2 and he stated that he only does at night. Pt did not report any discomfort and stated that he was "fine" when asked if he felt that he needed to be on O2. Nurse First notified.

## 2017-08-30 NOTE — Discharge Instructions (Signed)
Take percocet as needed for pain.   See Dr. [name] tomorrow regarding your back.   Continue taking your steroids as prescribed.   Keep foley in place. Call urology tomorrow for appointment   Return to ER if you have worse trouble breathing, leg swelling, severe pain, trouble walking.

## 2017-08-30 NOTE — ED Notes (Signed)
Patient transported to MRI 

## 2017-08-30 NOTE — ED Notes (Signed)
Pressure applied to IV removal site  - bandaged with 2x2 and paper tape

## 2017-08-30 NOTE — ED Notes (Signed)
Patient is extremely displeased regarding d/c (expected to be admitted to wait to see Dr. Glee ArvinKram in am. Patient's e-signature did not capture and this nurse did not want to further upset patient by requesting him to do so again. Patient had complained "ya'll are damn rushing me to get out".

## 2017-08-30 NOTE — ED Notes (Addendum)
Pt placed on 2L [place] d/t SpO2 sats dropping into the high 80s while resting. Pt reports wearing 2L at night baseline.

## 2017-08-30 NOTE — ED Notes (Signed)
Pt c/o bil & mid Cp

## 2017-08-30 NOTE — ED Notes (Signed)
Patient reports he feels the need to urinate. Urinal provided to patient. Patient states that he prefers to stand beside stretcher.

## 2017-08-30 NOTE — ED Notes (Signed)
Admitting MD at bedside.

## 2017-08-30 NOTE — ED Provider Notes (Addendum)
Gabriel Gibbs [facility] EMERGENCY DEPARTMENT Provider Note   CSN: [id number] Arrival date & time: 08/30/17  1204     History   Chief Complaint Chief Complaint  Patient presents with  . Leg Swelling  . Back Pain    HPI MICHEIL Gibbs is a 67 y.o. male history of CAD, diastolic CHF, who presented with shortness of breath, cough, leg swelling, trouble urinating.  Patient states that he was admitted to the hospital about a week ago and was diagnosed with COPD exacerbation and CHF.  Patient is still on steroid taper and since yesterday noticed bilateral ankle swelling.  Also has some trouble urinating since this morning.  Denies any dysuria or hematuria or abdominal pain or vomiting.  Patient has some shortness of breath last night but did not wake up short of breath.  During the last admission, patient had L3 fracture on CT and suppose to follow up with Dr. Wynetta Emery tomorrow. He also noticed increased R leg numbness but no weakness. Taking percocet with minimal relief.   The history is provided by the patient.    Past Medical History:  Diagnosis Date  . CAD (coronary artery disease)   . Essential hypertension   . GERD (gastroesophageal reflux disease)   . Headache(784.0)   . HLD (hyperlipidemia)   . Hx of bronchitis   . Psoriasis   . Shortness of breath   . Tobacco abuse     Patient Active Problem List   Diagnosis Date Noted  . Back pain 08/22/2017  . COPD with acute exacerbation (HCC) 08/22/2017  . Sepsis (HCC) 08/22/2017  . Acute on chronic respiratory failure with hypoxia (HCC) 08/22/2017  . Chronic systolic CHF (congestive heart failure) (HCC) 08/22/2017  . CAD (coronary artery disease) 08/22/2017  . Chest pain 08/22/2017  . Essential hypertension   . HLD (hyperlipidemia)   . GERD (gastroesophageal reflux disease)   . Tobacco abuse     Past Surgical History:  Procedure Laterality Date  . ANTERIOR CERVICAL DECOMP/DISCECTOMY FUSION N/A 05/24/2013   Procedure:  CERVICAL FOUR-FIVE ANTERIOR CERVICAL DISCECTOMY FUSION;  Surgeon: Mariam Dollar, MD;  Location: [facility];  Service: Neurosurgery;  Laterality: N/A;  . LEG SURGERY     right  . LIVER SURGERY     part of liver removed  . LUMBAR SPINE SURGERY  1980  . SPLENECTOMY, TOTAL         Home Medications    Prior to Admission medications   Medication Sig Start Date End Date Taking? Authorizing Provider  albuterol (ACCUNEB) 1.25 MG/3ML nebulizer solution Take 2 ampules by nebulization daily.    Yes [provider]  albuterol (PROVENTIL HFA;VENTOLIN HFA) 108 (90 BASE) MCG/ACT inhaler Inhale 2 puffs into the lungs 4 (four) times daily as needed for wheezing.   Yes [provider]  aspirin EC 81 MG tablet Take 81 mg by mouth daily.   Yes [provider]  benzonatate (TESSALON) 100 MG capsule Take 1 capsule (100 mg total) by mouth 3 (three) times daily as needed for cough. 08/24/17  Yes [name], [name], DO  carisoprodol (SOMA) 350 MG tablet Take 350 mg by mouth 3 (three) times daily as needed for muscle spasms.   Yes [provider]  carvedilol (COREG) 6.25 MG tablet Take 6.25 mg by mouth 2 (two) times daily. 06/23/17  Yes [provider]  clopidogrel (PLAVIX) 75 MG tablet Take 75 mg by mouth daily. 07/29/17  Yes [provider]  cyclobenzaprine (FLEXERIL) 10  MG tablet Take 1 tablet (10 mg total) by mouth 3 (three) times daily as needed for muscle spasms. 08/24/17  Yes [name], [name], DO  dextromethorphan-guaiFENesin (MUCINEX DM) 30-600 MG 12hr tablet Take 1 tablet by mouth 2 (two) times daily as needed for cough. 08/24/17  Yes [name], [name], DO  lisinopril (PRINIVIL,ZESTRIL) 20 MG tablet Take 20 mg by mouth daily. 06/04/17  Yes [provider]  oxyCODONE-acetaminophen (PERCOCET) 10-325 MG per tablet Take 1 tablet by mouth 3 (three) times daily as needed for pain.   Yes [provider]  pantoprazole (PROTONIX) 40 MG tablet Take 40  mg by mouth daily. 07/29/17  Yes [provider]  pravastatin (PRAVACHOL) 20 MG tablet Take 20 mg by mouth every evening.  07/29/17  Yes [provider]  predniSONE (DELTASONE) 10 MG tablet Take 4 tabs x 3 days, then 3 tabs x 3 days, then 2 tabs x 3days, then 1 tab x 3days 08/24/17  Yes [name], [place], DO  spironolactone (ALDACTONE) 25 MG tablet Take 25 mg by mouth daily. 07/29/17  Yes [provider]  triamcinolone cream (KENALOG) 0.1 % Apply 1 application topically 3 (three) times daily.   Yes [provider]  azithromycin (ZITHROMAX) 250 MG tablet Take 1 tablet (250 mg total) by mouth daily. Continuation of hospital course. Patient not taking: Reported on 08/30/2017 08/25/17   [name], DO  nicotine (NICODERM CQ - DOSED IN MG/24 HOURS) 21 mg/24hr patch Place 1 patch (21 mg total) onto the skin daily. Patient not taking: Reported on 08/30/2017 08/25/17   [name], DO    Family History Family History  Problem Relation Age of Onset  . Arthritis Mother   . Diabetes Mellitus II Father   . Heart disease Father     Social History Social History   Tobacco Use  . Smoking status: Current Every Day Smoker    Packs/day: 2.00    Years: 45.00    Pack years: 90.00    Types: Cigarettes  . Smokeless tobacco: Never Used  Substance Use Topics  . Alcohol use: No    Frequency: Never  . Drug use: No     Allergies   Patient has no known allergies.   Review of Systems Review of Systems  Respiratory: Positive for shortness of breath.   Cardiovascular: Positive for leg swelling.  Musculoskeletal: Positive for back pain.  Neurological: Positive for numbness.  All other systems reviewed and are negative.    Physical Exam Updated Vital Signs BP (!) 144/60   Pulse 64   Resp 14   SpO2 100%   Physical Exam  Constitutional: He is oriented to person, place, and time.  Uncomfortable   HENT:  Head: Normocephalic.  Eyes: Conjunctivae and  EOM are normal. Pupils are equal, round, and reactive to light.  Neck: Normal range of motion. Neck supple.  Cardiovascular: Normal rate, regular rhythm and normal heart sounds.  Pulmonary/Chest: Effort normal.  Diminished bilateral bases   Abdominal: Soft. Bowel sounds are normal. He exhibits no distension. There is no tenderness.  Musculoskeletal:  Lower lumbar tenderness, 1+ edema bilateral legs   Neurological: He is alert and oriented to person, place, and time.  Dec sensation lateral aspect R leg. No saddle anesthesia.   Skin: Skin is warm.  Psychiatric: He has a normal mood and affect.  Nursing note and vitals reviewed.    ED Treatments / Results  Labs (all labs ordered are listed, but only abnormal results are displayed) Labs Reviewed  CBC WITH DIFFERENTIAL/PLATELET - Abnormal; Notable for the following components:      Result Value   RDW 15.7 (*)    Monocytes Absolute 1.1 (*)    All other components within normal limits  COMPREHENSIVE METABOLIC PANEL - Abnormal; Notable for the following components:   Chloride 97 (*)    Glucose, Bld 116 (*)    BUN 22 (*)    Total Protein 5.9 (*)    All other components within normal limits  BRAIN NATRIURETIC PEPTIDE - Abnormal; Notable for the following components:   B Natriuretic Peptide 147.5 (*)    All other components within normal limits  URINALYSIS, ROUTINE W REFLEX MICROSCOPIC - Abnormal; Notable for the following components:   Hgb urine dipstick MODERATE (*)    Ketones, ur 5 (*)    All other components within normal limits  I-STAT TROPONIN, ED    EKG  EKG Interpretation  Date/Time:  Monday August 30 2017 13:00:02 EST Ventricular Rate:  70 PR Interval:  116 QRS Duration: 86 QT Interval:  392 QTC Calculation: 423 R Axis:   88 Text Interpretation:  Normal sinus rhythm Left ventricular hypertrophy with repolarization abnormality Cannot rule out Septal infarct , age undetermined Abnormal ECG No significant change since  last tracing Confirmed by Richardean Canal [phone]) on 08/30/2017 4:02:20 PM       Radiology Dg Chest 2 View  Result Date: 08/30/2017 CLINICAL DATA:  Peripheral edema, productive cough, and chest congestion for the past week. History of COPD, current smoker. EXAM: CHEST  2 VIEW COMPARISON:  CT scan of the chest of August 21, 2017 and chest x-ray of the same day FINDINGS: The lungs are well-expanded. The interstitial markings remain increased. There is no alveolar infiltrate. There is no pleural effusion. The heart is normal in size. The central pulmonary vascularity is prominent. There no cephalization of the vascular pattern. The mediastinum is normal in width. There calcification in the wall of the aortic arch. There is 60% anterior compression of the body of T7 which appears stable. IMPRESSION: Chronic bronchitic changes, stable. No alveolar pneumonia nor CHF. Probable pulmonary hypertension. Thoracic aortic atherosclerosis. Electronically Signed   By: David  [place] M.D.   On: 08/30/2017 13:25   Mr Lumbar Spine Wo Contrast  Result Date: 08/30/2017 CLINICAL DATA:  Initial evaluation for acute back pain, recent L3 fracture. EXAM: MRI LUMBAR SPINE WITHOUT CONTRAST TECHNIQUE: Multiplanar, multisequence MR imaging of the lumbar spine was performed. No intravenous contrast was administered. COMPARISON:  Prior CT from 08/21/2017. FINDINGS: Segmentation: Normal segmentation. Lowest well-formed disc labeled the L5-S1 level. Alignment: Vertebral bodies normally aligned with preservation of the normal lumbar lordosis. No listhesis or malalignment. Vertebrae: Acute to subacute compression fracture involving the L3 vertebral body again seen. Overall, height loss measures up to 55%, slightly worsened as compared to prior CT (previously 30%). Associated 6 mm bony retropulsion stable to slightly worsened. This fracture is benign/osteoporotic in appearance. Vertebral body heights are otherwise maintained. Underlying bone  marrow signal intensity within normal limits. No discrete or worrisome osseous lesions. Conus medullaris and cauda equina: Conus extends to the L1 level. Conus and cauda equina appear normal. Paraspinal and other soft tissues: Mild soft tissue edema within the lower right posterior paraspinous musculature (series 6, image 3), likely reactive due to the compression fracture. Mild edema also noted within the adjacent right psoas muscle. Paraspinous soft tissues otherwise within normal limits. Visualized visceral structures within normal limits. Disc levels: L1-2:  Unremarkable. L2-3: 6 mm  bony retropulsion of the posterior/superior aspect of the L3 vertebral body due to the compression fracture. Retropulsed bone flattens and indents the ventral thecal sac, resulting in mild to moderate spinal stenosis. Thecal sac measures 7 mm in AP diameter. Foramina remain patent. L3-4: Mild annular disc bulge. Superimposed left extraforaminal disc protrusion contacts the exiting left L3 nerve root as it courses of the left neural foramen (series 7, image 16). Mild facet and ligament flavum hypertrophy. No significant spinal stenosis. Foramina remain patent. L4-5: Mild diffuse disc bulge with disc desiccation and intervertebral disc space narrowing. Remote left hemi laminectomy. Mild facet hypertrophy. No significant canal or lateral recess stenosis. Mild bilateral L4 foraminal narrowing. L5-S1:  Minimal disc bulge.  No canal or foraminal stenosis. IMPRESSION: 1. Slight interval height loss about the acute to subacute L3 compression fracture, now measuring up to 55% (previously 30%). 6 mm bony retropulsion stable to slightly worsened. Associated mild to moderate spinal stenosis without impingement. 2. Shallow left extraforaminal disc protrusion at L3-4, contacting and potentially irritating the left L3 nerve root. 3. Prior left hemi laminectomy at L4-5. Electronically Signed   By: Rise MuBenjamin  McClintock M.D.   On: 08/30/2017 20:05     Procedures Procedures (including critical care time)  Medications Ordered in ED Medications  albuterol (PROVENTIL) (2.5 MG/3ML) 0.083% nebulizer solution 5 mg (5 mg Nebulization Given 08/30/17 1625)  ipratropium (ATROVENT) nebulizer solution 0.5 mg (0.5 mg Nebulization Given 08/30/17 1627)  fentaNYL (SUBLIMAZE) injection 50 mcg (50 mcg Intravenous Given 08/30/17 1635)  LORazepam (ATIVAN) injection 1 mg (1 mg Intravenous Given 08/30/17 1843)  diazepam (VALIUM) injection 5 mg (5 mg Intravenous Given 08/30/17 1910)  HYDROmorphone (DILAUDID) injection 1 mg (1 mg Intravenous Given 08/30/17 1910)  methylPREDNISolone sodium succinate (SOLU-MEDROL) 125 mg/2 mL injection 125 mg (125 mg Intravenous Given 08/30/17 2056)     Initial Impression / Assessment and Plan / ED Course  I have reviewed the triage vital signs and the nursing notes.  Pertinent labs & imaging results that were available during my care of the patient were reviewed by me and considered in my medical decision making (see chart for details).     Greggory KeenWilliam D Chernick is a 67 y.o. male here with R leg paresthesia, SOB, leg swelling. Consider COPD vs CHF exacerbation. Consider nerve compression from L3 fracture causing urinary retention vs sciatica. Will get labs, BNP, CXR, MRI lumbar spine.   9 pm Patient desat to 80s after valium for MRI. Patient's MRI showed L3 fracture with retropulsion. I called Dr. Dutch QuintPoole from neurosurgery. He states that this is not likely causing his urinary retention. Given desat to 2480s, will give steroids. Will admit for COPD exacerbation, urinary retention. Dr. [name] from neurosurgery will see patient tomorrow as consult. Dr. Julian ReilGardner to admit for hypoxia, L3 fracture.   11:22 PM Dr. Julian ReilGardner saw patient. At that point, patient's oxygen improved. He thinks likely from given valium for MRI. He is on prednisone taper. Patient ambulated with no assistance. Has follow up with Dr. [name] in the office tomorrow. Will  discharge home. Will have him follow up with urology for urinary retention.   11:32 PM Upon discharge, patient wants to remove foley. I told him that he may get back into retention. He understands the risks but still wants it removed. Will have him follow up with urology.   Final Clinical Impressions(s) / ED Diagnoses   Final diagnoses:  Other closed fracture of third lumbar vertebra with nonunion, subsequent encounter  Simple chronic  bronchitis (HCC)  Other closed fracture of third lumbar vertebra, initial encounter [facility])    ED Discharge Orders    None       Charlynne Pander, MD 08/30/17 2140    Charlynne Pander, MD 08/30/17 2322    Charlynne Pander, MD 08/30/17 [phone]

## 2017-08-30 NOTE — ED Notes (Signed)
Pt ambulated around nurse's station with cane and standby assistance. Pt's SpO2 saturations stayed at 94% + on room air during ambulation. Pt reports no sob or dizziness.

## 2017-08-30 NOTE — ED Notes (Signed)
ED Provider at bedside. 

## 2017-08-31 DIAGNOSIS — M545 Low back pain: Secondary | ICD-10-CM

## 2017-08-31 DIAGNOSIS — I5022 Chronic systolic (congestive) heart failure: Secondary | ICD-10-CM

## 2017-08-31 DIAGNOSIS — J449 Chronic obstructive pulmonary disease, unspecified: Secondary | ICD-10-CM

## 2017-08-31 NOTE — Consult Note (Signed)
Medical Consultation   Gabriel Gibbs  ZOX:[id number]  DOB: 04/01/1951  DOA: 08/30/2017  PCP: Quitman Livings, MD   Outpatient Specialists: Dr. Wynetta Emery (neuro surgery)   Requesting physician: Dr. Silverio Lay  Reason for consultation: Hypoxia   History of Present Illness: Gabriel Gibbs is an 67 y.o. male with h/o COPD, CAD, diastolic CHF.  Patient recently admitted to our service from 1/13 to 1/17 for COPD exacerbation.  Discharged on steroid taper.  Since discharge he has had stable breathing, denies any worsening SOB, DOE, etc.  Uses 2L O2 via [place] at night at baseline.  During the last admission, patient had L3 fracture on CT and suppose to follow up with Dr. Wynetta Emery tomorrow. He also noticed increased R leg numbness but no weakness. Taking percocet with minimal relief.   Today presents to the ED with mild peripheral edema, and ongoing severe back pain.   Review of Systems:  ROS As per HPI otherwise 10 point review of systems negative.    Past Medical History: Past Medical History:  Diagnosis Date  . CAD (coronary artery disease)   . Essential hypertension   . GERD (gastroesophageal reflux disease)   . Headache(784.0)   . HLD (hyperlipidemia)   . Hx of bronchitis   . Psoriasis   . Shortness of breath   . Tobacco abuse     Past Surgical History: Past Surgical History:  Procedure Laterality Date  . ANTERIOR CERVICAL DECOMP/DISCECTOMY FUSION N/A 05/24/2013   Procedure: CERVICAL FOUR-FIVE ANTERIOR CERVICAL DISCECTOMY FUSION;  Surgeon: Mariam Dollar, MD;  Location: [facility];  Service: Neurosurgery;  Laterality: N/A;  . LEG SURGERY     right  . LIVER SURGERY     part of liver removed  . LUMBAR SPINE SURGERY  1980  . SPLENECTOMY, TOTAL       Allergies:  No Known Allergies   Social History:  reports that he has been smoking cigarettes.  He has a 90.00 pack-year smoking history. he has never used smokeless tobacco. He reports that he does not drink  alcohol or use drugs.   Family History: Family History  Problem Relation Age of Onset  . Arthritis Mother   . Diabetes Mellitus II Father   . Heart disease Father      Physical Exam: Vitals:   08/30/17 2227 08/30/17 2230 08/30/17 2245 08/30/17 2300  BP: (!) 170/93 (!) 172/79 (!) 154/97 (!) 144/60  Pulse: 85 80 81 64  Resp: 14 13 14 14   SpO2: 98% 98% 98% 100%    Constitutional: Alert and awake, oriented x3, not in any acute distress. Eyes: PERLA, EOMI, irises appear normal, anicteric sclera,  ENMT: external ears and nose appear normal            Lips appears normal, oropharynx mucosa, tongue, posterior pharynx appear normal  Neck: neck appears normal, no masses, normal ROM, no thyromegaly, no JVD  CVS: S1-S2 clear, no murmur rubs or gallops, Trace BLE edema, normal pedal pulses  Respiratory:  clear to auscultation bilaterally, no wheezing, rales or rhonchi. Respiratory effort normal. No accessory muscle use.  Abdomen: soft nontender, nondistended, normal bowel sounds, no hepatosplenomegaly, no hernias  Musculoskeletal: : no cyanosis, clubbing or edema noted bilaterally Neuro: Cranial nerves II-XII intact, strength, sensation, reflexes Psych: judgement and insight appear normal, stable mood and affect, mental status Skin: no rashes or lesions or ulcers, no induration or nodules  Data reviewed:  I have personally reviewed following labs and imaging studies Labs:  CBC: Recent Labs  Lab 08/30/17 1301  WBC 10.3  NEUTROABS 6.4  HGB 14.2  HCT 42.4  MCV 97.9  PLT 379    Basic Metabolic Panel: Recent Labs  Lab 08/24/17 0922 08/30/17 1301  NA 136 137  K 4.4 4.8  CL 98* 97*  CO2 29 27  GLUCOSE 146* 116*  BUN 22* 22*  CREATININE 0.89 0.88  CALCIUM 9.2 8.9  MG 2.0  --    GFR Estimated Creatinine Clearance: 70 mL/min (by C-G formula based on SCr of 0.88 mg/dL). Liver Function Tests: Recent Labs  Lab 08/30/17 1301  AST 23  ALT 20  ALKPHOS 41  BILITOT 0.4    PROT 5.9*  ALBUMIN 3.5   No results for input(s): LIPASE, AMYLASE in the last 168 hours. No results for input(s): AMMONIA in the last 168 hours. Coagulation profile No results for input(s): INR, PROTIME in the last 168 hours.  Cardiac Enzymes: No results for input(s): CKTOTAL, CKMB, CKMBINDEX, TROPONINI in the last 168 hours. BNP: Invalid input(s): POCBNP CBG: No results for input(s): GLUCAP in the last 168 hours. D-Dimer No results for input(s): DDIMER in the last 72 hours. Hgb A1c No results for input(s): HGBA1C in the last 72 hours. Lipid Profile No results for input(s): CHOL, HDL, LDLCALC, TRIG, CHOLHDL, LDLDIRECT in the last 72 hours. Thyroid function studies No results for input(s): TSH, T4TOTAL, T3FREE, THYROIDAB in the last 72 hours.  Invalid input(s): FREET3 Anemia work up No results for input(s): VITAMINB12, FOLATE, FERRITIN, TIBC, IRON, RETICCTPCT in the last 72 hours. Urinalysis    Component Value Date/Time   COLORURINE YELLOW 08/30/2017 2042   APPEARANCEUR CLEAR 08/30/2017 2042   LABSPEC 1.018 08/30/2017 2042   PHURINE 6.0 08/30/2017 2042   GLUCOSEU NEGATIVE 08/30/2017 2042   HGBUR MODERATE (A) 08/30/2017 2042   BILIRUBINUR NEGATIVE 08/30/2017 2042   KETONESUR 5 (A) 08/30/2017 2042   PROTEINUR NEGATIVE 08/30/2017 2042   NITRITE NEGATIVE 08/30/2017 2042   LEUKOCYTESUR NEGATIVE 08/30/2017 2042     Microbiology Recent Results (from the past 240 hour(s))  Culture, blood (routine x 2) Call MD if unable to obtain prior to antibiotics being given     Status: None   Collection Time: 08/22/17  2:00 AM  Result Value Ref Range Status   Specimen Description BLOOD RIGHT WRIST  Final   Special Requests   Final    BOTTLES DRAWN AEROBIC AND ANAEROBIC Blood Culture adequate volume   Culture NO GROWTH 5 DAYS  Final   Report Status 08/27/2017 FINAL  Final  Culture, blood (routine x 2) Call MD if unable to obtain prior to antibiotics being given     Status: None    Collection Time: 08/22/17  2:04 AM  Result Value Ref Range Status   Specimen Description BLOOD LEFT HAND  Final   Special Requests   Final    BOTTLES DRAWN AEROBIC AND ANAEROBIC Blood Culture adequate volume   Culture NO GROWTH 5 DAYS  Final   Report Status 08/27/2017 FINAL  Final  Respiratory Panel by PCR     Status: None   Collection Time: 08/22/17 11:01 AM  Result Value Ref Range Status   Adenovirus NOT DETECTED NOT DETECTED Final   Coronavirus 229E NOT DETECTED NOT DETECTED Final   Coronavirus HKU1 NOT DETECTED NOT DETECTED Final   Coronavirus NL63 NOT DETECTED NOT DETECTED Final   Coronavirus OC43 NOT DETECTED NOT  DETECTED Final   Metapneumovirus NOT DETECTED NOT DETECTED Final   Rhinovirus / Enterovirus NOT DETECTED NOT DETECTED Final   Influenza A NOT DETECTED NOT DETECTED Final   Influenza B NOT DETECTED NOT DETECTED Final   Parainfluenza Virus 1 NOT DETECTED NOT DETECTED Final   Parainfluenza Virus 2 NOT DETECTED NOT DETECTED Final   Parainfluenza Virus 3 NOT DETECTED NOT DETECTED Final   Parainfluenza Virus 4 NOT DETECTED NOT DETECTED Final   Respiratory Syncytial Virus NOT DETECTED NOT DETECTED Final   Bordetella pertussis NOT DETECTED NOT DETECTED Final   Chlamydophila pneumoniae NOT DETECTED NOT DETECTED Final   Mycoplasma pneumoniae NOT DETECTED NOT DETECTED Final       Inpatient Medications:   Scheduled Meds: Continuous Infusions:   Radiological Exams on Admission: Dg Chest 2 View  Result Date: 08/30/2017 CLINICAL DATA:  Peripheral edema, productive cough, and chest congestion for the past week. History of COPD, current smoker. EXAM: CHEST  2 VIEW COMPARISON:  CT scan of the chest of August 21, 2017 and chest x-ray of the same day FINDINGS: The lungs are well-expanded. The interstitial markings remain increased. There is no alveolar infiltrate. There is no pleural effusion. The heart is normal in size. The central pulmonary vascularity is prominent. There no  cephalization of the vascular pattern. The mediastinum is normal in width. There calcification in the wall of the aortic arch. There is 60% anterior compression of the body of T7 which appears stable. IMPRESSION: Chronic bronchitic changes, stable. No alveolar pneumonia nor CHF. Probable pulmonary hypertension. Thoracic aortic atherosclerosis. Electronically Signed   By: David  [place] M.D.   On: 08/30/2017 13:25   Mr Lumbar Spine Wo Contrast  Result Date: 08/30/2017 CLINICAL DATA:  Initial evaluation for acute back pain, recent L3 fracture. EXAM: MRI LUMBAR SPINE WITHOUT CONTRAST TECHNIQUE: Multiplanar, multisequence MR imaging of the lumbar spine was performed. No intravenous contrast was administered. COMPARISON:  Prior CT from 08/21/2017. FINDINGS: Segmentation: Normal segmentation. Lowest well-formed disc labeled the L5-S1 level. Alignment: Vertebral bodies normally aligned with preservation of the normal lumbar lordosis. No listhesis or malalignment. Vertebrae: Acute to subacute compression fracture involving the L3 vertebral body again seen. Overall, height loss measures up to 55%, slightly worsened as compared to prior CT (previously 30%). Associated 6 mm bony retropulsion stable to slightly worsened. This fracture is benign/osteoporotic in appearance. Vertebral body heights are otherwise maintained. Underlying bone marrow signal intensity within normal limits. No discrete or worrisome osseous lesions. Conus medullaris and cauda equina: Conus extends to the L1 level. Conus and cauda equina appear normal. Paraspinal and other soft tissues: Mild soft tissue edema within the lower right posterior paraspinous musculature (series 6, image 3), likely reactive due to the compression fracture. Mild edema also noted within the adjacent right psoas muscle. Paraspinous soft tissues otherwise within normal limits. Visualized visceral structures within normal limits. Disc levels: L1-2:  Unremarkable. L2-3: 6 mm bony  retropulsion of the posterior/superior aspect of the L3 vertebral body due to the compression fracture. Retropulsed bone flattens and indents the ventral thecal sac, resulting in mild to moderate spinal stenosis. Thecal sac measures 7 mm in AP diameter. Foramina remain patent. L3-4: Mild annular disc bulge. Superimposed left extraforaminal disc protrusion contacts the exiting left L3 nerve root as it courses of the left neural foramen (series 7, image 16). Mild facet and ligament flavum hypertrophy. No significant spinal stenosis. Foramina remain patent. L4-5: Mild diffuse disc bulge with disc desiccation and intervertebral disc space narrowing. Remote  left hemi laminectomy. Mild facet hypertrophy. No significant canal or lateral recess stenosis. Mild bilateral L4 foraminal narrowing. L5-S1:  Minimal disc bulge.  No canal or foraminal stenosis. IMPRESSION: 1. Slight interval height loss about the acute to subacute L3 compression fracture, now measuring up to 55% (previously 30%). 6 mm bony retropulsion stable to slightly worsened. Associated mild to moderate spinal stenosis without impingement. 2. Shallow left extraforaminal disc protrusion at L3-4, contacting and potentially irritating the left L3 nerve root. 3. Prior left hemi laminectomy at L4-5. Electronically Signed   By: Rise Mu M.D.   On: 08/30/2017 20:05    Impression/Recommendations Active Problems:   Back pain   Chronic systolic CHF (congestive heart failure) (HCC)   COPD (chronic obstructive pulmonary disease) (HCC)   1. COPD - doesn't appear to be in acute exacerbation at this time.  Now satting 100% on 2L.  Patient reports symptoms are at baseline.  Suspect desat earlier during ED visit was due to large quantity of sedating meds that patient got which have now worn off, including valium, ativan, dilaudid, and fentanyl. 2. CHF - again does not appear to be in acute exacerbation at this time.  Mild increased leg swelling likely  secondary to prednisone. 3. Back pain, due to subacute L3 compression fracture. - 1. MRI shows no neurosurgical emergency 2. Patient has office visit with Dr. Wynetta Emery already scheduled for later today.    Zakia Sainato M. D.O. [facility] 08/31/2017, 4:33 AM

## 2017-12-28 DIAGNOSIS — R079 Chest pain, unspecified: Secondary | ICD-10-CM | POA: Diagnosis not present

## 2017-12-29 DIAGNOSIS — R0789 Other chest pain: Secondary | ICD-10-CM

## 2017-12-29 DIAGNOSIS — R079 Chest pain, unspecified: Secondary | ICD-10-CM | POA: Diagnosis not present

## 2018-08-25 DIAGNOSIS — R079 Chest pain, unspecified: Secondary | ICD-10-CM

## 2018-08-25 DIAGNOSIS — I10 Essential (primary) hypertension: Secondary | ICD-10-CM | POA: Diagnosis not present

## 2018-08-25 DIAGNOSIS — J449 Chronic obstructive pulmonary disease, unspecified: Secondary | ICD-10-CM | POA: Diagnosis not present

## 2018-08-25 DIAGNOSIS — I509 Heart failure, unspecified: Secondary | ICD-10-CM

## 2018-08-26 DIAGNOSIS — R079 Chest pain, unspecified: Secondary | ICD-10-CM | POA: Diagnosis not present

## 2018-08-26 DIAGNOSIS — J449 Chronic obstructive pulmonary disease, unspecified: Secondary | ICD-10-CM | POA: Diagnosis not present

## 2018-08-26 DIAGNOSIS — I10 Essential (primary) hypertension: Secondary | ICD-10-CM | POA: Diagnosis not present

## 2018-08-26 DIAGNOSIS — I509 Heart failure, unspecified: Secondary | ICD-10-CM | POA: Diagnosis not present

## 2018-09-25 ENCOUNTER — Inpatient Hospital Stay (HOSPITAL_COMMUNITY)
Admission: EM | Admit: 2018-09-25 | Discharge: 2018-09-29 | DRG: 190 | Disposition: A | Discharge status: Home / Self Care | Payer: Medicare HMO | Attending: Internal Medicine | Admitting: Internal Medicine

## 2018-09-25 ENCOUNTER — Inpatient Hospital Stay (HOSPITAL_COMMUNITY): Payer: Medicare HMO

## 2018-09-25 ENCOUNTER — Encounter (HOSPITAL_COMMUNITY): Payer: Self-pay

## 2018-09-25 ENCOUNTER — Other Ambulatory Visit: Payer: Self-pay

## 2018-09-25 ENCOUNTER — Emergency Department (HOSPITAL_COMMUNITY): Payer: Medicare HMO

## 2018-09-25 DIAGNOSIS — J439 Emphysema, unspecified: Secondary | ICD-10-CM | POA: Diagnosis present

## 2018-09-25 DIAGNOSIS — R001 Bradycardia, unspecified: Secondary | ICD-10-CM | POA: Diagnosis not present

## 2018-09-25 DIAGNOSIS — Z9081 Acquired absence of spleen: Secondary | ICD-10-CM | POA: Diagnosis not present

## 2018-09-25 DIAGNOSIS — M549 Dorsalgia, unspecified: Secondary | ICD-10-CM

## 2018-09-25 DIAGNOSIS — Z8249 Family history of ischemic heart disease and other diseases of the circulatory system: Secondary | ICD-10-CM

## 2018-09-25 DIAGNOSIS — Z8261 Family history of arthritis: Secondary | ICD-10-CM

## 2018-09-25 DIAGNOSIS — F419 Anxiety disorder, unspecified: Secondary | ICD-10-CM | POA: Diagnosis present

## 2018-09-25 DIAGNOSIS — I4891 Unspecified atrial fibrillation: Secondary | ICD-10-CM | POA: Diagnosis not present

## 2018-09-25 DIAGNOSIS — Z9981 Dependence on supplemental oxygen: Secondary | ICD-10-CM

## 2018-09-25 DIAGNOSIS — Z833 Family history of diabetes mellitus: Secondary | ICD-10-CM

## 2018-09-25 DIAGNOSIS — J441 Chronic obstructive pulmonary disease with (acute) exacerbation: Secondary | ICD-10-CM | POA: Diagnosis present

## 2018-09-25 DIAGNOSIS — J209 Acute bronchitis, unspecified: Secondary | ICD-10-CM | POA: Diagnosis present

## 2018-09-25 DIAGNOSIS — R0781 Pleurodynia: Secondary | ICD-10-CM | POA: Diagnosis present

## 2018-09-25 DIAGNOSIS — K219 Gastro-esophageal reflux disease without esophagitis: Secondary | ICD-10-CM | POA: Diagnosis present

## 2018-09-25 DIAGNOSIS — Z720 Tobacco use: Secondary | ICD-10-CM | POA: Diagnosis present

## 2018-09-25 DIAGNOSIS — I2510 Atherosclerotic heart disease of native coronary artery without angina pectoris: Secondary | ICD-10-CM | POA: Diagnosis present

## 2018-09-25 DIAGNOSIS — Z7952 Long term (current) use of systemic steroids: Secondary | ICD-10-CM | POA: Diagnosis not present

## 2018-09-25 DIAGNOSIS — J9621 Acute and chronic respiratory failure with hypoxia: Secondary | ICD-10-CM | POA: Diagnosis present

## 2018-09-25 DIAGNOSIS — I10 Essential (primary) hypertension: Secondary | ICD-10-CM | POA: Diagnosis present

## 2018-09-25 DIAGNOSIS — Z981 Arthrodesis status: Secondary | ICD-10-CM

## 2018-09-25 DIAGNOSIS — Z7982 Long term (current) use of aspirin: Secondary | ICD-10-CM

## 2018-09-25 DIAGNOSIS — M4856XS Collapsed vertebra, not elsewhere classified, lumbar region, sequela of fracture: Secondary | ICD-10-CM | POA: Diagnosis present

## 2018-09-25 DIAGNOSIS — N179 Acute kidney failure, unspecified: Secondary | ICD-10-CM | POA: Diagnosis not present

## 2018-09-25 DIAGNOSIS — E785 Hyperlipidemia, unspecified: Secondary | ICD-10-CM | POA: Diagnosis present

## 2018-09-25 DIAGNOSIS — I5022 Chronic systolic (congestive) heart failure: Secondary | ICD-10-CM | POA: Diagnosis present

## 2018-09-25 DIAGNOSIS — I110 Hypertensive heart disease with heart failure: Secondary | ICD-10-CM | POA: Diagnosis present

## 2018-09-25 DIAGNOSIS — M48061 Spinal stenosis, lumbar region without neurogenic claudication: Secondary | ICD-10-CM | POA: Diagnosis present

## 2018-09-25 DIAGNOSIS — L409 Psoriasis, unspecified: Secondary | ICD-10-CM | POA: Diagnosis present

## 2018-09-25 DIAGNOSIS — F17210 Nicotine dependence, cigarettes, uncomplicated: Secondary | ICD-10-CM | POA: Diagnosis present

## 2018-09-25 DIAGNOSIS — Z79899 Other long term (current) drug therapy: Secondary | ICD-10-CM

## 2018-09-25 DIAGNOSIS — Z7902 Long term (current) use of antithrombotics/antiplatelets: Secondary | ICD-10-CM

## 2018-09-25 DIAGNOSIS — T447X5A Adverse effect of beta-adrenoreceptor antagonists, initial encounter: Secondary | ICD-10-CM | POA: Diagnosis not present

## 2018-09-25 LAB — URINALYSIS, ROUTINE W REFLEX MICROSCOPIC
Bilirubin Urine: NEGATIVE
Glucose, UA: NEGATIVE mg/dL
HGB URINE DIPSTICK: NEGATIVE
KETONES UR: 5 mg/dL — AB
Leukocytes,Ua: NEGATIVE
Nitrite: NEGATIVE
PROTEIN: NEGATIVE mg/dL
Specific Gravity, Urine: 1.027 (ref 1.005–1.030)
pH: 7 (ref 5.0–8.0)

## 2018-09-25 LAB — CBC
HCT: 44.4 % (ref 39.0–52.0)
HEMATOCRIT: 46.8 % (ref 39.0–52.0)
HEMOGLOBIN: 14.2 g/dL (ref 13.0–17.0)
Hemoglobin: 14.9 g/dL (ref 13.0–17.0)
MCH: 31.3 pg (ref 26.0–34.0)
MCH: 31.4 pg (ref 26.0–34.0)
MCHC: 31.8 g/dL (ref 30.0–36.0)
MCHC: 32 g/dL (ref 30.0–36.0)
MCV: 98.3 fL (ref 80.0–100.0)
MCV: 98.2 fL (ref 80.0–100.0)
NRBC: 0 % (ref 0.0–0.2)
Platelets: 259 10*3/uL (ref 150–400)
Platelets: 260 10*3/uL (ref 150–400)
RBC: 4.76 MIL/uL (ref 4.22–5.81)
RBC: 4.52 MIL/uL (ref 4.22–5.81)
RDW: 14.7 % (ref 11.5–15.5)
RDW: 14.8 % (ref 11.5–15.5)
WBC: 9.3 10*3/uL (ref 4.0–10.5)
WBC: 9.5 10*3/uL (ref 4.0–10.5)
nRBC: 0 % (ref 0.0–0.2)

## 2018-09-25 LAB — BASIC METABOLIC PANEL
Anion gap: 5 (ref 5–15)
BUN: 21 mg/dL (ref 8–23)
CHLORIDE: 101 mmol/L (ref 98–111)
CO2: 33 mmol/L — ABNORMAL HIGH (ref 22–32)
CREATININE: 0.77 mg/dL (ref 0.61–1.24)
Calcium: 8.9 mg/dL (ref 8.9–10.3)
Glucose, Bld: 103 mg/dL — ABNORMAL HIGH (ref 70–99)
POTASSIUM: 4.1 mmol/L (ref 3.5–5.1)
SODIUM: 139 mmol/L (ref 135–145)

## 2018-09-25 LAB — BRAIN NATRIURETIC PEPTIDE: B Natriuretic Peptide: 492.2 pg/mL — ABNORMAL HIGH (ref 0.0–100.0)

## 2018-09-25 LAB — HEPATIC FUNCTION PANEL
ALT: 19 U/L (ref 0–44)
AST: 22 U/L (ref 15–41)
Albumin: 3.6 g/dL (ref 3.5–5.0)
Alkaline Phosphatase: 37 U/L — ABNORMAL LOW (ref 38–126)
TOTAL PROTEIN: 6.4 g/dL — AB (ref 6.5–8.1)
Total Bilirubin: 0.6 mg/dL (ref 0.3–1.2)

## 2018-09-25 LAB — I-STAT TROPONIN, ED: TROPONIN I, POC: 0 ng/mL (ref 0.00–0.08)

## 2018-09-25 LAB — CREATININE, SERUM
Creatinine, Ser: 0.78 mg/dL (ref 0.61–1.24)
GFR calc Af Amer: >60 mL/min (ref >60)
GFR calc non Af Amer: >60 mL/min (ref >60)

## 2018-09-25 MED ORDER — ASPIRIN EC 81 MG PO TBEC
81.0000 mg | DELAYED_RELEASE_TABLET | Freq: Every day | ORAL | Status: DC
  Administered 2018-09-26 – 2018-09-28 (×3): 81 mg via ORAL
  Filled 2018-09-25 (×3): qty 1

## 2018-09-25 MED ORDER — LEVOFLOXACIN IN D5W 750 MG/150ML IV SOLN
750.0000 mg | INTRAVENOUS | Status: DC
  Administered 2018-09-26 (×2): 750 mg via INTRAVENOUS
  Filled 2018-09-25 (×2): qty 150

## 2018-09-25 MED ORDER — ONDANSETRON HCL 4 MG PO TABS: 4.0000 mg | ORAL_TABLET | Freq: Four times a day (QID) | ORAL | Status: DC | PRN

## 2018-09-25 MED ORDER — ALBUTEROL SULFATE 1.25 MG/3ML IN NEBU: 2.0000 | INHALATION_SOLUTION | Freq: Every day | RESPIRATORY_TRACT | Status: DC

## 2018-09-25 MED ORDER — OXYCODONE-ACETAMINOPHEN 5-325 MG PO TABS
1.0000 | ORAL_TABLET | Freq: Four times a day (QID) | ORAL | Status: DC | PRN
  Administered 2018-09-26 – 2018-09-29 (×8): 1 via ORAL
  Filled 2018-09-25 (×9): qty 1

## 2018-09-25 MED ORDER — ALBUTEROL SULFATE (2.5 MG/3ML) 0.083% IN NEBU
2.5000 mg | INHALATION_SOLUTION | Freq: Four times a day (QID) | RESPIRATORY_TRACT | Status: DC | PRN
  Administered 2018-09-28: 2.5 mg via RESPIRATORY_TRACT
  Filled 2018-09-25: qty 3

## 2018-09-25 MED ORDER — PRAVASTATIN SODIUM 10 MG PO TABS
20.0000 mg | ORAL_TABLET | Freq: Every day | ORAL | Status: DC
  Administered 2018-09-26 – 2018-09-28 (×4): 20 mg via ORAL
  Filled 2018-09-25 (×4): qty 2

## 2018-09-25 MED ORDER — PANTOPRAZOLE SODIUM 40 MG PO TBEC
40.0000 mg | DELAYED_RELEASE_TABLET | Freq: Every day | ORAL | Status: DC
  Administered 2018-09-26 – 2018-09-29 (×4): 40 mg via ORAL
  Filled 2018-09-25 (×5): qty 1

## 2018-09-25 MED ORDER — METHYLPREDNISOLONE SODIUM SUCC 125 MG IJ SOLR
60.0000 mg | Freq: Two times a day (BID) | INTRAMUSCULAR | Status: DC
  Administered 2018-09-26 – 2018-09-27 (×3): 60 mg via INTRAVENOUS
  Filled 2018-09-25 (×4): qty 2

## 2018-09-25 MED ORDER — SPIRONOLACTONE 25 MG PO TABS
25.0000 mg | ORAL_TABLET | Freq: Every day | ORAL | Status: DC
  Administered 2018-09-26: 25 mg via ORAL
  Filled 2018-09-25 (×2): qty 1

## 2018-09-25 MED ORDER — TORSEMIDE 20 MG PO TABS
10.0000 mg | ORAL_TABLET | Freq: Every day | ORAL | Status: DC
  Administered 2018-09-26 (×2): 10 mg via ORAL
  Filled 2018-09-25 (×2): qty 1

## 2018-09-25 MED ORDER — NICOTINE 21 MG/24HR TD PT24
21.0000 mg | MEDICATED_PATCH | Freq: Every day | TRANSDERMAL | Status: DC
  Filled 2018-09-25 (×4): qty 1

## 2018-09-25 MED ORDER — CARVEDILOL 25 MG PO TABS
25.0000 mg | ORAL_TABLET | Freq: Two times a day (BID) | ORAL | Status: DC
  Administered 2018-09-26 – 2018-09-27 (×3): 25 mg via ORAL
  Filled 2018-09-25 (×3): qty 1

## 2018-09-25 MED ORDER — BENZONATATE 100 MG PO CAPS: 100.0000 mg | ORAL_CAPSULE | Freq: Three times a day (TID) | ORAL | Status: DC | PRN

## 2018-09-25 MED ORDER — ENOXAPARIN SODIUM 40 MG/0.4ML ~~LOC~~ SOLN
40.0000 mg | SUBCUTANEOUS | Status: DC
  Administered 2018-09-26 – 2018-09-28 (×3): 40 mg via SUBCUTANEOUS
  Filled 2018-09-25 (×4): qty 0.4

## 2018-09-25 MED ORDER — LISINOPRIL 40 MG PO TABS
40.0000 mg | ORAL_TABLET | Freq: Every day | ORAL | Status: DC
  Administered 2018-09-26 – 2018-09-28 (×3): 40 mg via ORAL
  Filled 2018-09-25 (×3): qty 1

## 2018-09-25 MED ORDER — IPRATROPIUM-ALBUTEROL 0.5-2.5 (3) MG/3ML IN SOLN
3.0000 mL | Freq: Four times a day (QID) | RESPIRATORY_TRACT | Status: DC
  Administered 2018-09-26 (×2): 3 mL via RESPIRATORY_TRACT
  Filled 2018-09-25 (×5): qty 3

## 2018-09-25 MED ORDER — OXYCODONE HCL 5 MG PO TABS
5.0000 mg | ORAL_TABLET | Freq: Four times a day (QID) | ORAL | Status: DC | PRN
  Administered 2018-09-26 – 2018-09-29 (×7): 5 mg via ORAL
  Filled 2018-09-25 (×8): qty 1

## 2018-09-25 MED ORDER — ONDANSETRON HCL 4 MG/2ML IJ SOLN: 4.0000 mg | Freq: Four times a day (QID) | INTRAMUSCULAR | Status: DC | PRN

## 2018-09-25 MED ORDER — FENTANYL CITRATE (PF) 100 MCG/2ML IJ SOLN
50.0000 ug | Freq: Once | INTRAMUSCULAR | Status: AC
Start: 2018-09-25 — End: 2018-09-25
  Administered 2018-09-25: 50 ug via INTRAVENOUS
  Filled 2018-09-25: qty 2

## 2018-09-25 MED ORDER — TRIAMCINOLONE ACETONIDE 0.1 % EX CREA
1.0000 "application " | TOPICAL_CREAM | Freq: Three times a day (TID) | CUTANEOUS | Status: DC | PRN
  Administered 2018-09-27: 1 via TOPICAL
  Filled 2018-09-25: qty 15

## 2018-09-25 MED ORDER — DM-GUAIFENESIN ER 30-600 MG PO TB12: 1.0000 | ORAL_TABLET | Freq: Two times a day (BID) | ORAL | Status: DC | PRN

## 2018-09-25 MED ORDER — CYCLOBENZAPRINE HCL 10 MG PO TABS
10.0000 mg | ORAL_TABLET | Freq: Three times a day (TID) | ORAL | Status: DC | PRN
  Administered 2018-09-27 – 2018-09-29 (×4): 10 mg via ORAL
  Filled 2018-09-25 (×4): qty 1

## 2018-09-25 MED ORDER — SODIUM CHLORIDE 0.9% FLUSH: 3.0000 mL | Freq: Once | INTRAVENOUS | Status: DC

## 2018-09-25 MED ORDER — CLOPIDOGREL BISULFATE 75 MG PO TABS: 75.0000 mg | ORAL_TABLET | Freq: Every day | ORAL | Status: DC

## 2018-09-25 MED ORDER — OXYCODONE-ACETAMINOPHEN 10-325 MG PO TABS: 1.0000 | ORAL_TABLET | Freq: Four times a day (QID) | ORAL | Status: DC | PRN

## 2018-09-25 MED ORDER — IPRATROPIUM-ALBUTEROL 0.5-2.5 (3) MG/3ML IN SOLN
3.0000 mL | Freq: Once | RESPIRATORY_TRACT | Status: AC
  Administered 2018-09-25: 3 mL via RESPIRATORY_TRACT
  Filled 2018-09-25: qty 3

## 2018-09-25 NOTE — ED Notes (Signed)
Pt brought back from MRI.  Per pt he was unable to complete MRI.  Pt stated he was "not able to do what they wanted me to do".  Pt stated that someone in MRI said they called and talked to nurse and was told to bring back to room.  No one called this nurse, other nurses in POD, MD, or PA.

## 2018-09-25 NOTE — ED Triage Notes (Signed)
Pt has had recent hospitalizations for COPD. Chest pain, leg swelling, and shortness of breath is worsening.  Pt on oxygen 2L via [place] at home at night, has been  Having to wear oxygen during day.  RA SpO2 on arrival on RA 88%. Walking makes chest pain worse. Pt props to sleep.

## 2018-09-25 NOTE — Progress Notes (Signed)
Gabriel Gibbs spoke to Fisher in the ED informing her that Mr.Touray refused to do the exam because he could not breathe and he could not lay flat. I called back and spoke to Porter and asked her to please relay message to [employer].

## 2018-09-25 NOTE — Progress Notes (Signed)
Pt refusing exam., cannot tolerate laying flat due to pain, cannot breathe while laying flat. Also states experiencing anxiety. RN aware.

## 2018-09-25 NOTE — ED Notes (Signed)
Dr. Mikeal Hawthorne text paged and returned page.  No new orders.

## 2018-09-25 NOTE — ED Notes (Signed)
Report attempted 

## 2018-09-25 NOTE — H&P (Signed)
History and Physical   [name]:[id number] DOB: Dec 11, 1950 DOA: 09/25/2018  Referring MD/NP/PA: Dr. Benjiman Gibbs  PCP: Gabriel Livings, MD   Outpatient Specialists: None    Patient coming from: Home  Chief Complaint: Shortness of breath  HPI: Gabriel Gibbs is a 68 y.o. male with medical history significant of systolic dysfunction CHF, hypertension, coronary artery disease, GERD, hyperlipidemia who has been going to [facility] for his care.  He also has history of COPD.  Patient recently was treated with systolic dysfunction with EF in the region of 20 to 30%.  Patient did much better.  He has lost about 8 pounds of fluids.  He however continues to have significant shortness of breath with wheeze.  Patient also noted reaccumulation of his lower extremity edema.  He came to the ER because of worsening shortness of breath.  Patient was found to be wheezing.  Chest x-ray showed no evidence of fluid overload.  BNP is much lower than what it was while at [facility].  Patient is therefore being admitted with COPD exacerbation..  ED Course: Temperature is 98 blood pressure 08/25/2005, pulse 92 respiratory 27 sat 93% on 2 L.  Sodium is 139 potassium 4.1 chloride 01027 glucose 103 BUN 20 creatinine 770 gap of 5.  LFTs appear to be within normal BNP is 492. CBC showed a white count of 9.3 hemoglobin 14.9 platelets 259.  Chest x-ray showed emphysema and hyperinflated lungs suggesting COPD no acute cardiopulmonary findings.  Patient is therefore being admitted.  COPD exacerbation.  Review of Systems: As per HPI otherwise 10 point review of systems negative.    Past Medical History:  Diagnosis Date  . CAD (coronary artery disease)   . Essential hypertension   . GERD (gastroesophageal reflux disease)   . Headache(784.0)   . HLD (hyperlipidemia)   . Hx of bronchitis   . Psoriasis   . Shortness of breath   . Tobacco abuse     Past Surgical History:  Procedure  Laterality Date  . ANTERIOR CERVICAL DECOMP/DISCECTOMY FUSION N/A 05/24/2013   Procedure: CERVICAL FOUR-FIVE ANTERIOR CERVICAL DISCECTOMY FUSION;  Surgeon: Gabriel Dollar, MD;  Location: [facility];  Service: Neurosurgery;  Laterality: N/A;  . LEG SURGERY     right  . LIVER SURGERY     part of liver removed  . LUMBAR SPINE SURGERY  1980  . SPLENECTOMY, TOTAL       reports that he has been smoking cigarettes. He has a 90.00 pack-year smoking history. He has never used smokeless tobacco. He reports that he does not drink alcohol or use drugs.  No Known Allergies  Family History  Problem Relation Age of Onset  . Arthritis Mother   . Diabetes Mellitus II Father   . Heart disease Father      Prior to Admission medications   Medication Sig Start Date End Date Taking? Authorizing Provider  aspirin EC 81 MG tablet Take 81 mg by mouth daily.   Yes [provider]  carvedilol (COREG) 25 MG tablet Take 25 mg by mouth 2 (two) times daily with a meal.   Yes [provider]  lisinopril (PRINIVIL,ZESTRIL) 40 MG tablet Take 40 mg by mouth daily.   Yes [provider]  oxyCODONE-acetaminophen (PERCOCET) 10-325 MG tablet Take 1 tablet by mouth 3 (three) times daily as needed for pain. Patient taking differently: Take 1 tablet by mouth 4 (four) times daily as needed for pain.  08/30/17  Yes  Gabriel Pander, MD  OXYGEN Inhale 2 L into the lungs continuous.   Yes [provider]  pravastatin (PRAVACHOL) 20 MG tablet Take 20 mg by mouth at bedtime.  07/29/17  Yes [provider]  spironolactone (ALDACTONE) 25 MG tablet Take 25 mg by mouth daily. 07/29/17  Yes [provider]  torsemide (DEMADEX) 10 MG tablet Take 10 mg by mouth daily.   Yes [provider]  triamcinolone cream (KENALOG) 0.1 % Apply 1 application topically 3 (three) times daily as needed (for itching).    Yes [provider]  albuterol (ACCUNEB) 1.25 MG/3ML  nebulizer solution Take 2 ampules by nebulization daily.     [provider]  albuterol (PROVENTIL HFA;VENTOLIN HFA) 108 (90 BASE) MCG/ACT inhaler Inhale 2 puffs into the lungs 4 (four) times daily as needed for wheezing.    [provider]  azithromycin (ZITHROMAX) 250 MG tablet Take 1 tablet (250 mg total) by mouth daily. Continuation of hospital course. 08/25/17   Gabriel Gibbs, Gabriel Sells, DO  benzonatate (TESSALON) 100 MG capsule Take 1 capsule (100 mg total) by mouth 3 (three) times daily as needed for cough. 08/24/17   Gabriel Petrin, DO  carisoprodol (SOMA) 350 MG tablet Take 350 mg by mouth 3 (three) times daily as needed for muscle spasms.    [provider]  clopidogrel (PLAVIX) 75 MG tablet Take 75 mg by mouth daily. 07/29/17   [provider]  cyclobenzaprine (FLEXERIL) 10 MG tablet Take 1 tablet (10 mg total) by mouth 3 (three) times daily as needed for muscle spasms. 08/24/17   Gabriel Gibbs, Gabriel Sells, DO  dextromethorphan-guaiFENesin (MUCINEX DM) 30-600 MG 12hr tablet Take 1 tablet by mouth 2 (two) times daily as needed for cough. 08/24/17   Gabriel Gibbs, Gabriel Sells, DO  nicotine (NICODERM CQ - DOSED IN MG/24 HOURS) 21 mg/24hr patch Place 1 patch (21 mg total) onto the skin daily. Patient not taking: Reported on 09/25/2018 08/25/17   Gabriel Petrin, DO  pantoprazole (PROTONIX) 40 MG tablet Take 40 mg by mouth daily. 07/29/17   [provider]  predniSONE (DELTASONE) 10 MG tablet Take 4 tabs x 3 days, then 3 tabs x 3 days, then 2 tabs x 3days, then 1 tab x 3days Patient taking differently: Take 10 mg by mouth as directed. Take 4 tabs x 3 days, then 3 tabs x 3 days, then 2 tabs x 3days, then 1 tab x 3days 08/24/17   Gabriel Petrin, DO    Physical Exam: Vitals:   09/25/18 1800 09/25/18 1815 09/25/18 1830 09/25/18 1845  BP: (!) 145/68 (!) 159/54 (!) 115/103 (!) 142/64  Pulse: 75 85 86 76  Resp: 19 16 17  (!) 25  Temp:      TempSrc:      SpO2: 96% 100% 97% 93%    Weight:          Constitutional: NAD, calm, comfortable Vitals:   09/25/18 1800 09/25/18 1815 09/25/18 1830 09/25/18 1845  BP: (!) 145/68 (!) 159/54 (!) 115/103 (!) 142/64  Pulse: 75 85 86 76  Resp: 19 16 17  (!) 25  Temp:      TempSrc:      SpO2: 96% 100% 97% 93%  Weight:       Eyes: PERRL, lids and conjunctivae normal ENMT: Mucous membranes are moist. Posterior pharynx clear of any exudate or lesions.Normal dentition.  Neck: normal, supple, no masses, no thyromegaly Respiratory: decreased air entry with widespread expiratory wheezing, no crackles. Normal respiratory effort. No accessory muscle  use.  Cardiovascular: Regular rate and rhythm, 2/5 systolic ejection murmurs, no rubs / gallops. No extremity edema. 2+ pedal pulses. No carotid bruits.  Abdomen: no tenderness, no masses palpated. No hepatosplenomegaly. Bowel sounds positive.  Musculoskeletal: no clubbing / cyanosis. No joint deformity upper and lower extremities. Good ROM, no contractures. Normal muscle tone.  Skin: no rashes, lesions, ulcers. No induration Neurologic: CN 2-12 grossly intact. Sensation intact, DTR normal. Strength 5/5 in all 4.  Psychiatric: Normal judgment and insight. Alert and oriented x 3. Normal mood.     Labs on Admission: I have personally reviewed following labs and imaging studies  CBC: Recent Labs  Lab 09/25/18 1506  WBC 9.3  HGB 14.9  HCT 46.8  MCV 98.3  PLT 259   Basic Metabolic Panel: Recent Labs  Lab 09/25/18 1506  NA 139  K 4.1  CL 101  CO2 33*  GLUCOSE 103*  BUN 21  CREATININE 0.77  CALCIUM 8.9   GFR: CrCl cannot be calculated (Unknown ideal weight.). Liver Function Tests: Recent Labs  Lab 09/25/18 1506  AST 22  ALT 19  ALKPHOS 37*  BILITOT 0.6  PROT 6.4*  ALBUMIN 3.6   No results for input(s): LIPASE, AMYLASE in the last 168 hours. No results for input(s): AMMONIA in the last 168 hours. Coagulation Profile: No results for input(s): INR, PROTIME in the  last 168 hours. Cardiac Enzymes: No results for input(s): CKTOTAL, CKMB, CKMBINDEX, TROPONINI in the last 168 hours. BNP (last 3 results) No results for input(s): PROBNP in the last 8760 hours. HbA1C: No results for input(s): HGBA1C in the last 72 hours. CBG: No results for input(s): GLUCAP in the last 168 hours. Lipid Profile: No results for input(s): CHOL, HDL, LDLCALC, TRIG, CHOLHDL, LDLDIRECT in the last 72 hours. Thyroid Function Tests: No results for input(s): TSH, T4TOTAL, FREET4, T3FREE, THYROIDAB in the last 72 hours. Anemia Panel: No results for input(s): VITAMINB12, FOLATE, FERRITIN, TIBC, IRON, RETICCTPCT in the last 72 hours. Urine analysis:    Component Value Date/Time   COLORURINE YELLOW 09/25/2018 1838   APPEARANCEUR CLEAR 09/25/2018 1838   LABSPEC 1.027 09/25/2018 1838   PHURINE 7.0 09/25/2018 1838   GLUCOSEU NEGATIVE 09/25/2018 1838   HGBUR NEGATIVE 09/25/2018 1838   BILIRUBINUR NEGATIVE 09/25/2018 1838   KETONESUR 5 (A) 09/25/2018 1838   PROTEINUR NEGATIVE 09/25/2018 1838   NITRITE NEGATIVE 09/25/2018 1838   LEUKOCYTESUR NEGATIVE 09/25/2018 1838   Sepsis Labs: @LABRCNTIP (procalcitonin:4,lacticidven:4) )No results found for this or any previous visit (from the past 240 hour(s)).   Radiological Exams on Admission: Dg Chest 2 View  Result Date: 09/25/2018 CLINICAL DATA:  Chest pain EXAM: CHEST - 2 VIEW COMPARISON:  08/25/2018 chest radiograph. FINDINGS: Surgical hardware from ACDF overlies the lower cervical spine. Stable cardiomediastinal silhouette with normal heart size. No pneumothorax. No pleural effusion. Emphysema. Hyperinflated lungs. Mild scarring at the left lung base, stable. No acute consolidative airspace disease. No pulmonary edema. Surgical clips in the left upper quadrant of the abdomen. IMPRESSION: 1. Emphysema and hyperinflated lungs, suggesting COPD. 2. Stable mild scarring at the left lung base. No acute cardiopulmonary disease. Electronically  Signed   By: Delbert Phenix M.D.   On: 09/25/2018 16:20    EKG: Independently reviewed.  Shows sinus rhythm with a rate of 79.  Evidence of LVH, atrial premature complexes and no significant ST changes.  Assessment/Plan Principal Problem:   COPD with acute exacerbation (HCC) Active Problems:   Essential hypertension   HLD (hyperlipidemia)  GERD (gastroesophageal reflux disease)   Tobacco abuse   Back pain   Chronic systolic CHF (congestive heart failure) (HCC)   CAD (coronary artery disease)     #1 acute on chronic respiratory failure: Secondary to COPD exacerbation.  Patient will be admitted and started on IV steroids with nebulizer and antibiotics.  Will continue on home regimen for her CHF but appears to be compensated at this point.  Transition to oral prednisone and other medications.  #2 acute exacerbation of systolic CHF: Continue diuresis and other cardiac medications.  #3 hypertension: Continue with home regimen blood pressure so decreased  #4 hyperlipidemia: Continue with statin.  #5 severe anxiety: Prior history of anxiety disorder we will continue with home regimen.  #6 severe back pain: Chronic low back pain.  Suspected lumbar degenerative disease.  On chronic steroids.  Patient will continue with calcium..  We will continue with MRI of the lumbar spine to rule out any compression fracture.   DVT prophylaxis: Lovenox Code Status: Full code Family Communication: Daughter and son at bedside Disposition Plan: To be determined Consults called: None Admission status: Inpatient  Severity of Illness: The appropriate patient status for this patient is INPATIENT. Inpatient status is judged to be reasonable and necessary in order to provide the required intensity of service to ensure the patient's safety. The patient's presenting symptoms, physical exam findings, and initial radiographic and laboratory data in the context of their chronic comorbidities is felt to place them  at high risk for further clinical deterioration. Furthermore, it is not anticipated that the patient will be medically stable for discharge from the hospital within 2 midnights of admission. The following factors support the patient status of inpatient.   " The patient's presenting symptoms include shortness of breath. " The worrisome physical exam findings include expiratory wheezing and anxiety. " The initial radiographic and laboratory data are worrisome because of evidence of COPD but no pulmonary edema. " The chronic co-morbidities include CHF and COPD.   * I certify that at the point of admission it is my clinical judgment that the patient will require inpatient hospital care spanning beyond 2 midnights from the point of admission due to high intensity of service, high risk for further deterioration and high frequency of surveillance required.Lonia Blood*    Gabriel Gibbs,LAWAL MD [facility] Pager [phone]  If 7PM-7AM, please contact night-coverage [email or web address] Password [email or web address]  09/25/2018, 7:11 PM
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
## 2018-09-26 LAB — CBC
HCT: 46.5 % (ref 39.0–52.0)
Hemoglobin: 15.1 g/dL (ref 13.0–17.0)
MCH: 31.9 pg (ref 26.0–34.0)
MCHC: 32.5 g/dL (ref 30.0–36.0)
MCV: 98.1 fL (ref 80.0–100.0)
PLATELETS: 226 10*3/uL (ref 150–400)
RBC: 4.74 MIL/uL (ref 4.22–5.81)
RDW: 14.8 % (ref 11.5–15.5)
WBC: 8.4 10*3/uL (ref 4.0–10.5)
nRBC: 0 % (ref 0.0–0.2)

## 2018-09-26 LAB — COMPREHENSIVE METABOLIC PANEL
ALT: 17 U/L (ref 0–44)
AST: 22 U/L (ref 15–41)
Albumin: 3.5 g/dL (ref 3.5–5.0)
Alkaline Phosphatase: 38 U/L (ref 38–126)
Anion gap: 10 (ref 5–15)
BUN: 17 mg/dL (ref 8–23)
CO2: 26 mmol/L (ref 22–32)
CREATININE: 0.71 mg/dL (ref 0.61–1.24)
Calcium: 9.1 mg/dL (ref 8.9–10.3)
Chloride: 102 mmol/L (ref 98–111)
GFR calc Af Amer: >60 mL/min (ref >60)
GFR calc non Af Amer: >60 mL/min (ref >60)
Glucose, Bld: 111 mg/dL — ABNORMAL HIGH (ref 70–99)
Potassium: 4.1 mmol/L (ref 3.5–5.1)
Sodium: 138 mmol/L (ref 135–145)
Total Bilirubin: 1 mg/dL (ref 0.3–1.2)
Total Protein: 6.4 g/dL — ABNORMAL LOW (ref 6.5–8.1)

## 2018-09-26 LAB — HIV ANTIBODY (ROUTINE TESTING W REFLEX): HIV Screen 4th Generation wRfx: NONREACTIVE

## 2018-09-26 MED ORDER — ALPRAZOLAM 0.5 MG PO TABS
0.5000 mg | ORAL_TABLET | Freq: Two times a day (BID) | ORAL | Status: DC | PRN
  Administered 2018-09-27: 0.5 mg via ORAL
  Filled 2018-09-26 (×2): qty 1

## 2018-09-26 MED ORDER — LORAZEPAM 2 MG/ML IJ SOLN
1.0000 mg | Freq: Once | INTRAMUSCULAR | Status: AC | PRN
  Administered 2018-09-27: 2 mg via INTRAVENOUS
  Filled 2018-09-26: qty 1

## 2018-09-26 MED ORDER — TORSEMIDE 10 MG PO TABS
5.0000 mg | ORAL_TABLET | Freq: Every day | ORAL | Status: DC
  Filled 2018-09-26 (×2): qty 0.5

## 2018-09-26 MED ORDER — SPIRONOLACTONE 12.5 MG HALF TABLET
12.5000 mg | ORAL_TABLET | ORAL | Status: DC
  Filled 2018-09-26: qty 1

## 2018-09-26 NOTE — Progress Notes (Addendum)
PROGRESS NOTE    Gabriel KeenWilliam D Debruhl  VFI:[id number] DOB: 07/07/1951 DOA: 09/25/2018 PCP: Quitman LivingsHassan, Sami, MD     Brief Narrative:  Gabriel Gibbs is a 68 y.o. male with medical history significant of chronic systolic dysfunction CHF, hypertension, coronary artery disease, GERD, hyperlipidemia who has been going to [facility] for his care. Patient recently was treated with systolic dysfunction with EF in the region of 20 to 30%.  Patient did much better.  He has lost about 8 pounds of fluids.  He however continues to have significant shortness of breath with wheeze.  Patient also noted reaccumulation of his lower extremity edema.  He came to the ER because of worsening shortness of breath.  Patient was found to be wheezing.  Chest x-ray showed no evidence of fluid overload.  BNP is much lower than what it was while at [facility].  Patient admitted with COPD exacerbation..  New events last 24 hours / Subjective: Patient could not tolerate MRI lumbar spine yesterday, stating that he could not lay flat.  He states that his back pain has been ongoing for a long time.  He states that his breathing about the same as yesterday, although on exam his lungs are not moving much air.  States that his ankles are swollen. He uses [place] O2 2L only qhs, currently requiring all day.   Assessment & Plan:   Principal Problem:   COPD with acute exacerbation (HCC) Active Problems:   Essential hypertension   HLD (hyperlipidemia)   GERD (gastroesophageal reflux disease)   Tobacco abuse   Back pain   Chronic systolic CHF (congestive heart failure) (HCC)   CAD (coronary artery disease)   Acute on chronic hypoxemic respiratory failure Requires 2 L nasal cannula O2 nightly, currently on  O2 during the day which is increased from his baseline  Continue to wean down as able  Acute COPD exacerbation Continue IV steroids, nebulizer, Levaquin  Chronic systolic heart failure Patient with mild edema of  bilateral ankles. BNP 492. Chest x-ray reviewed independently, shows hyperinflated lungs without focal consolidation or overt pulmonary edema  Records from [facility] ([facility] reviewed. He had been admitted at [facility] in Jan [facility], BNP at that time was >8000. He was started on torsemide 5mg  QD and spironolactone 12.5mg  QOD  Do not see acute need for cardiology consultation in hospital, continue to monitor. Continue coreg, torsemide, spironolactone   CAD Continue aspirin  Essential hypertension Continue lisinopril   Hyperlipidemia Continue pravachol   Chronic low back pain Patient states that he could hardly get out of bed the other day.  He is on chronic steroids.  Lumbar MRI ordered to rule out compression fracture PT OT  DVT prophylaxis: Lovenox Code Status: Full code Family Communication: No family at bedside Disposition Plan: PT OT ordered  Patient to remain inpatient today. He is admitted for acute on chronic hypoxemic respiratory failure, increased O2 requirements from his baseline needs. On exam, his lungs are quite diminished and moving little air. He is at risk of decompensation. He will require further treatments with IV steroids, neb treatments, antibiotics.    Consultants:   None  Procedures:   None  Antimicrobials:  Anti-infectives (From admission, onward)   Start     Dose/Rate Route Frequency Ordered Stop   09/25/18 2115  levofloxacin (LEVAQUIN) IVPB 750 mg     750 mg 100 mL/hr over 90 Minutes Intravenous Every 24 hours 09/25/18 2110  Objective: Vitals:   09/25/18 2054 09/25/18 2347 09/26/18 0740 09/26/18 0836  BP: (!) 146/88 121/74 103/62 (!) 137/52  Pulse: (!) 105 (!) 107 75 93  Resp: 20 (!) 24 20   Temp: 98.1 F (36.7 C) 98.5 F (36.9 C) 97.7 F (36.5 C)   TempSrc: Oral Oral Oral   SpO2: 98% 97% 96%   Weight: 56.6 kg     Height: 5\' 9"  (1.753 m)       Intake/Output Summary (Last 24 hours) at  09/26/2018 1127 Last data filed at 09/26/2018 0224 Gross per 24 hour  Intake 150 ml  Output 600 ml  Net -450 ml   Filed Weights   09/25/18 1648 09/25/18 2054  Weight: 57.9 kg 56.6 kg    Examination:  General exam: Appears calm and comfortable  Respiratory system: Diminished breath sounds bilaterally, no respiratory distress on nasal cannula O2 Cardiovascular system: S1 & S2 heard, RRR. No JVD, murmurs, rubs, gallops or clicks.  Minimal edema around bilateral ankles Gastrointestinal system: Abdomen is nondistended, soft and nontender. No organomegaly or masses felt. Normal bowel sounds heard. Central nervous system: Alert and oriented. No focal neurological deficits. Extremities: Symmetric 5 x 5 power. Skin: No rashes, lesions or ulcers Psychiatry: Judgement and insight appear normal. Mood & affect appropriate.   Data Reviewed: I have personally reviewed following labs and imaging studies  CBC: Recent Labs  Lab 09/25/18 1506 09/25/18 2155 09/26/18 0240  WBC 9.3 9.5 8.4  HGB 14.9 14.2 15.1  HCT 46.8 44.4 46.5  MCV 98.3 98.2 98.1  PLT 259 260 226   Basic Metabolic Panel: Recent Labs  Lab 09/25/18 1506 09/25/18 2155 09/26/18 0240  NA 139  --  138  K 4.1  --  4.1  CL 101  --  102  CO2 33*  --  26  GLUCOSE 103*  --  111*  BUN 21  --  17  CREATININE 0.77 0.78 0.71  CALCIUM 8.9  --  9.1   GFR: Estimated Creatinine Clearance: 71.7 mL/min (by C-G formula based on SCr of 0.71 mg/dL). Liver Function Tests: Recent Labs  Lab 09/25/18 1506 09/26/18 0240  AST 22 22  ALT 19 17  ALKPHOS 37* 38  BILITOT 0.6 1.0  PROT 6.4* 6.4*  ALBUMIN 3.6 3.5   No results for input(s): LIPASE, AMYLASE in the last 168 hours. No results for input(s): AMMONIA in the last 168 hours. Coagulation Profile: No results for input(s): INR, PROTIME in the last 168 hours. Cardiac Enzymes: No results for input(s): CKTOTAL, CKMB, CKMBINDEX, TROPONINI in the last 168 hours. BNP (last 3 results) No  results for input(s): PROBNP in the last 8760 hours. HbA1C: No results for input(s): HGBA1C in the last 72 hours. CBG: No results for input(s): GLUCAP in the last 168 hours. Lipid Profile: No results for input(s): CHOL, HDL, LDLCALC, TRIG, CHOLHDL, LDLDIRECT in the last 72 hours. Thyroid Function Tests: No results for input(s): TSH, T4TOTAL, FREET4, T3FREE, THYROIDAB in the last 72 hours. Anemia Panel: No results for input(s): VITAMINB12, FOLATE, FERRITIN, TIBC, IRON, RETICCTPCT in the last 72 hours. Sepsis Labs: No results for input(s): PROCALCITON, LATICACIDVEN in the last 168 hours.  No results found for this or any previous visit (from the past 240 hour(s)).     Radiology Studies: Dg Chest 2 View  Result Date: 09/25/2018 CLINICAL DATA:  Chest pain EXAM: CHEST - 2 VIEW COMPARISON:  08/25/2018 chest radiograph. FINDINGS: Surgical hardware from ACDF overlies the lower cervical spine. Stable  cardiomediastinal silhouette with normal heart size. No pneumothorax. No pleural effusion. Emphysema. Hyperinflated lungs. Mild scarring at the left lung base, stable. No acute consolidative airspace disease. No pulmonary edema. Surgical clips in the left upper quadrant of the abdomen. IMPRESSION: 1. Emphysema and hyperinflated lungs, suggesting COPD. 2. Stable mild scarring at the left lung base. No acute cardiopulmonary disease. Electronically Signed   By: Delbert Phenix M.D.   On: 09/25/2018 16:20      Scheduled Meds: . aspirin EC  81 mg Oral Daily  . carvedilol  25 mg Oral BID WC  . clopidogrel  75 mg Oral Daily  . enoxaparin (LOVENOX) injection  40 mg Subcutaneous Q24H  . ipratropium-albuterol  3 mL Nebulization Q6H  . lisinopril  40 mg Oral Daily  . methylPREDNISolone (SOLU-MEDROL) injection  60 mg Intravenous Q12H  . nicotine  21 mg Transdermal Daily  . pantoprazole  40 mg Oral Daily  . pravastatin  20 mg Oral QHS  . sodium chloride flush  3 mL Intravenous Once  . spironolactone  25 mg  Oral Daily  . torsemide  10 mg Oral Daily   Continuous Infusions: . levofloxacin (LEVAQUIN) IV Stopped (09/26/18 0730)     LOS: 1 day    Time spent: 40 minutes   Noralee Stain, DO [facility] [email or web address] 09/26/2018, 11:27 AM

## 2018-09-26 NOTE — Evaluation (Signed)
Physical Therapy Evaluation Patient Details Name: Gabriel Gibbs MRN: [id number] DOB: Sep 25, 1950 Today's Date: 09/26/2018   History of Present Illness   Gabriel Gibbs is a 68 y.o. male with medical history significant of systolic dysfunction CHF, hypertension, coronary artery disease, GERD, chronic back pain with L3 compression fx, hyperlipidemia and COPD who has been going to [facility] for his care. He presented with SOB and admitted for acute exacerbation of COPD.  Clinical Impression  Patient presents with decreased mobility due to limited cardiorespiratory reserve, back pain and limited activity tolerance.  He is currently S level but mostly due to fall risk with pain and generalized weakness.  He has significant generalized weakness including LE's, core musculature affecting his back and shoulders.  Feel he could benefit from outpatient PT once pulmonary function is maximized via pulmonary rehab if appropriate.      Follow Up Recommendations Other (comment)(consider pulmonary rehab prior to outpatient PT)    Equipment Recommendations  None recommended by PT    Recommendations for Other Services       Precautions / Restrictions Precautions Precautions: Fall Precaution Comments: wears O2 when lying down, resting or sleeping      Mobility  Bed Mobility Overal bed mobility: Modified Independent             General bed mobility comments: increased time, variety of techniques due to pain, cannot lay flat, but starts propped on elbow on L side, then gradually can to flat  Transfers Overall transfer level: Modified independent Equipment used: None             General transfer comment: no AD initially to stand or to pivot to [facility], used for ambulation  Ambulation/Gait Ambulation/Gait assistance: Min guard;Supervision Gait Distance (Feet): 12 Feet(no walker, then 60' with walker minguard for safety/UE support) Assistive device: Rolling walker (2 wheeled) Gait  Pattern/deviations: Step-through pattern;Step-to pattern;Decreased stride length;Trunk flexed     General Gait Details: cues for proximity to walker and assist for safety especially turning; limited due to SOB  Stairs            Wheelchair Mobility    Modified Rankin (Stroke Patients Only)       Balance Overall balance assessment: Mild deficits observed, not formally tested                                           Pertinent Vitals/Pain Pain Assessment: 0-10 Pain Score: 7  Pain Location: lower back on R and mid back  Pain Descriptors / Indicators: Grimacing;Guarding Pain Intervention(s): Monitored during session;Limited activity within patient's tolerance    Home Living Family/patient expects to be discharged to:: Private residence Living Arrangements: Alone Available Help at Discharge: Family;Available PRN/intermittently Type of Home: Mobile home Home Access: Ramped entrance     Home Layout: One level Home Equipment: Walker - 2 wheels Additional Comments: states his mother lives next door and has "couple of walkers"    Prior Function Level of Independence: Independent(over limited distances due to back pain and dyspnea)               Hand Dominance        Extremity/Trunk Assessment   Upper Extremity Assessment Upper Extremity Assessment: Generalized weakness(scapulae elevated and upwardly rotated, reports shoulder pain and unable to lift fully with obvious wasting of scapular stabilizers)    Lower Extremity Assessment Lower Extremity  Assessment: RLE deficits/detail;LLE deficits/detail RLE Deficits / Details: AROM WFL, strength hip flexion 3/5, knee extension 4+/5, ankle DF 3+/5 LLE Deficits / Details: AROM WFL, strength hip flexion 3/5, knee extension 4+/5, ankle DF 3+/5    Cervical / Trunk Assessment Cervical / Trunk Assessment: Kyphotic  Communication   Communication: No difficulties  Cognition Arousal/Alertness:  Awake/alert Behavior During Therapy: WFL for tasks assessed/performed Overall Cognitive Status: Within Functional Limits for tasks assessed                                        General Comments General comments (skin integrity, edema, etc.): SpO2 on RA with ambulation 90-91%, at rest desat to 88%, back on 1-2L O2 at rest    Exercises     Assessment/Plan    PT Assessment Patient needs continued PT services  PT Problem List Decreased strength;Decreased activity tolerance;Decreased range of motion;Decreased mobility;Decreased knowledge of use of DME;Cardiopulmonary status limiting activity       PT Treatment Interventions DME instruction;Therapeutic exercise;Gait training;Balance training;Manual techniques;Functional mobility training;Patient/family education    PT Goals (Current goals can be found in the Care Plan section)  Acute Rehab PT Goals Patient Stated Goal: to go home PT Goal Formulation: With patient Time For Goal Achievement: 10/03/18 Potential to Achieve Goals: Good    Frequency Min 3X/week   Barriers to discharge        Co-evaluation               AM-PAC PT "6 Clicks" Mobility  Outcome Measure Help needed turning from your back to your side while in a flat bed without using bedrails?: None Help needed moving from lying on your back to sitting on the side of a flat bed without using bedrails?: None Help needed moving to and from a bed to a chair (including a wheelchair)?: A Little Help needed standing up from a chair using your arms (e.g., wheelchair or bedside chair)?: A Little Help needed to walk in hospital room?: A Little Help needed climbing 3-5 steps with a railing? : A Little 6 Click Score: 20    End of Session Equipment Utilized During Treatment: Gait belt Activity Tolerance: Patient limited by fatigue Patient left: in bed;with call bell/phone within reach   PT Visit Diagnosis: Other abnormalities of gait and mobility  (R26.89)    Time: 0093-8182 PT Time Calculation (min) (ACUTE ONLY): 24 min   Charges:   PT Evaluation $PT Eval Moderate Complexity: 1 Mod PT Treatments $Gait Training: 8-22 mins        Sheran Lawless, PT Acute Rehabilitation Services [phone] 09/26/2018   [name] 09/26/2018, 11:23 AM

## 2018-09-26 NOTE — Progress Notes (Signed)
Spoke with Dr. Alvino Chapel during progressive rounds and made MD aware that patient is requesting a cardiology consult and states that he will go for the MRI but needs to have something to help with claustrophobia.  MD acknowledged and gave no new orders at this time.

## 2018-09-26 NOTE — Evaluation (Addendum)
Occupational Therapy Evaluation Patient Details Name: Gabriel Gibbs MRN: [id number] DOB: 22-Apr-1951 Today's Date: 09/26/2018    History of Present Illness  Gabriel Gibbs is a 68 y.o. male with medical history significant of systolic dysfunction CHF, hypertension, coronary artery disease, GERD, chronic back pain with L3 compression fx, hyperlipidemia and COPD who has been going to [facility] for his care. He presented with SOB and admitted for acute exacerbation of COPD.   Clinical Impression   Pt admitted with above diagnoses, with cardiopulmonary status and decreased activity tolerance decreasing ability to engage in BADL at desired level of ind. PTA pt living alone and completing all BADL/IADL at mod I. Pt reports using 2L [place] as needed throughout the day (increased activity, at night). He is on 2L  this session. Pt presents completing all BADL at supervision- min guard level with cardiopulm status and LB pain limiting. Educated pt on energy conservation strategies in BADL/IADL, problem solved community engagement after pt shares this is getting increasingly difficult. Reinforcement of ECS needed. Recommend pt use BSC at d/c for shower chair/ as needed in home, pt not agreeable, fixated on cost. No OT follow up indicated at this time. Will continue to follow acutely.      Follow Up Recommendations  No OT follow up;Supervision - Intermittent    Equipment Recommendations  None recommended by OT    Recommendations for Other Services       Precautions / Restrictions Precautions Precautions: Fall Precaution Comments: wears O2 when lying down, resting or sleeping Restrictions Weight Bearing Restrictions: No      Mobility Bed Mobility Overal bed mobility: Modified Independent             General bed mobility comments: increased time, variety of techniques due to pain, reports having learned log rolling, cannot lay flat, but starts propped on elbow on L side, then  gradually can to flat  Transfers Overall transfer level: Modified independent Equipment used: None             General transfer comment: RW for longer distances    Balance Overall balance assessment: Mild deficits observed, not formally tested                                         ADL either performed or assessed with clinical judgement   ADL Overall ADL's : Needs assistance/impaired Eating/Feeding: Modified independent;Sitting   Grooming: Supervision/safety;Standing   Upper Body Bathing: Sitting;Supervision/ safety   Lower Body Bathing: Sit to/from stand;Sitting/lateral leans;Min guard   Upper Body Dressing : Set up;Sitting   Lower Body Dressing: Min guard;Sit to/from stand;Sitting/lateral leans   Toilet Transfer: Min guard;BSC   Toileting- Clothing Manipulation and Hygiene: Supervision/safety;Sitting/lateral lean;Sit to/from stand   Tub/ Engineer, structural: Min guard;Shower seat   Functional mobility during ADLs: Supervision/safety;Rolling walker(with and without RW) General ADL Comments: Pt showing supervision level A-min guard for BADL this date. Decrased activity tolerance and respiratory status limiting, as well as pain in lower/mid back from possible compression fx     Vision Baseline Vision/History: No visual deficits Patient Visual Report: No change from baseline Vision Assessment?: No apparent visual deficits     Perception     Praxis      Pertinent Vitals/Pain Pain Assessment: Faces Faces Pain Scale: Hurts little more Pain Location: lower back on R and mid back  Pain Descriptors / Indicators: Grimacing;Guarding Pain  Intervention(s): Monitored during session;Limited activity within patient's tolerance;Repositioned     Hand Dominance     Extremity/Trunk Assessment Upper Extremity Assessment Upper Extremity Assessment: Generalized weakness(weakness and wasting noted around scap stabilizers, difficulty completing full mob)    Lower Extremity Assessment Lower Extremity Assessment: Defer to PT evaluation       Communication Communication Communication: No difficulties   Cognition Arousal/Alertness: Awake/alert Behavior During Therapy: WFL for tasks assessed/performed;Flat affect Overall Cognitive Status: Within Functional Limits for tasks assessed                                     General Comments       Exercises     Shoulder Instructions      Home Living Family/patient expects to be discharged to:: Private residence Living Arrangements: Alone Available Help at Discharge: Family;Available PRN/intermittently Type of Home: Mobile home Home Access: Ramped entrance     Home Layout: One level     Bathroom Shower/Tub: Chief Strategy Officer: Standard     Home Equipment: Environmental consultant - 2 wheels   Additional Comments: reports having "a few canes and a few walkers"      Prior Functioning/Environment Level of Independence: Independent with assistive device(s)        Comments: reports using DME when going further distances. Shares that IADL management has been getting increasingly difficult        OT Problem List: Decreased strength;Decreased activity tolerance;Decreased knowledge of use of DME or AE;Cardiopulmonary status limiting activity;Impaired UE functional use;Decreased range of motion;Impaired balance (sitting and/or standing);Pain      OT Treatment/Interventions: Self-care/ADL training;DME and/or AE instruction;Therapeutic activities;Balance training;Therapeutic exercise;Patient/family education;Energy conservation    OT Goals(Current goals can be found in the care plan section) Acute Rehab OT Goals Patient Stated Goal: to go home OT Goal Formulation: With patient Time For Goal Achievement: 10/10/18 Potential to Achieve Goals: Good ADL Goals Pt Will Perform Grooming: with modified independence;standing Pt Will Perform Lower Body Bathing: with modified  independence;sit to/from stand;sitting/lateral leans Pt Will Perform Lower Body Dressing: with modified independence;sitting/lateral leans;sit to/from stand Pt Will Perform Tub/Shower Transfer: with modified independence;shower seat;ambulating Additional ADL Goal #1: Pt will recall and/or apply 1-3 ECS strategies while engaging in BADL task  OT Frequency: Min 2X/week   Barriers to D/C:            Co-evaluation              AM-PAC OT "6 Clicks" Daily Activity     Outcome Measure Help from another person eating meals?: None Help from another person taking care of personal grooming?: None Help from another person toileting, which includes using toliet, bedpan, or urinal?: None Help from another person bathing (including washing, rinsing, drying)?: A Little Help from another person to put on and taking off regular upper body clothing?: None Help from another person to put on and taking off regular lower body clothing?: A Little 6 Click Score: 14   End of Session Equipment Utilized During Treatment: Oxygen;Gait belt  Activity Tolerance: Patient tolerated treatment well Patient left: in bed;with call bell/phone within reach  OT Visit Diagnosis: Muscle weakness (generalized) (M62.81);Other abnormalities of gait and mobility (R26.89);Pain Pain - Right/Left: (mid) Pain - part of body: (back)                Time: 7341-9379 OT Time Calculation (min): 20 min Charges:  OT General  Charges $OT Visit: 1 Visit OT Evaluation $OT Eval Moderate Complexity: 1 Mod  Dalphine HandingKaylee Mykel Sponaugle, MSOT, OTR/L Behavioral Health OT/ Acute Relief OT [facility] Office: [phone]  Dalphine HandingKaylee Batu Cassin 09/26/2018, 5:50 PM

## 2018-09-27 LAB — BASIC METABOLIC PANEL
Anion gap: 9 (ref 5–15)
BUN: 28 mg/dL — ABNORMAL HIGH (ref 8–23)
CHLORIDE: 98 mmol/L (ref 98–111)
CO2: 31 mmol/L (ref 22–32)
Calcium: 9.1 mg/dL (ref 8.9–10.3)
Creatinine, Ser: 1.09 mg/dL (ref 0.61–1.24)
GFR calc Af Amer: >60 mL/min (ref >60)
GFR calc non Af Amer: >60 mL/min (ref >60)
Glucose, Bld: 181 mg/dL — ABNORMAL HIGH (ref 70–99)
Potassium: 4.4 mmol/L (ref 3.5–5.1)
Sodium: 138 mmol/L (ref 135–145)

## 2018-09-27 MED ORDER — METHYLPREDNISOLONE SODIUM SUCC 125 MG IJ SOLR
60.0000 mg | Freq: Three times a day (TID) | INTRAMUSCULAR | Status: DC
  Administered 2018-09-27 – 2018-09-28 (×3): 60 mg via INTRAVENOUS
  Filled 2018-09-27 (×3): qty 2

## 2018-09-27 MED ORDER — IPRATROPIUM-ALBUTEROL 0.5-2.5 (3) MG/3ML IN SOLN
3.0000 mL | Freq: Four times a day (QID) | RESPIRATORY_TRACT | Status: DC
  Administered 2018-09-27 – 2018-09-28 (×5): 3 mL via RESPIRATORY_TRACT
  Filled 2018-09-27 (×7): qty 3

## 2018-09-27 MED ORDER — DOXYCYCLINE HYCLATE 100 MG PO TABS
100.0000 mg | ORAL_TABLET | Freq: Two times a day (BID) | ORAL | Status: DC
  Administered 2018-09-27 – 2018-09-29 (×5): 100 mg via ORAL
  Filled 2018-09-27 (×5): qty 1

## 2018-09-27 NOTE — Progress Notes (Addendum)
PROGRESS NOTE    Gabriel Gibbs  VUD:[id number] DOB: 14-Oct-1950 DOA: 09/25/2018 PCP: Quitman Livings, MD     Brief Narrative:  Gabriel Gibbs is a 68 y.o. male with medical history significant of chronic systolic dysfunction CHF, hypertension, coronary artery disease, GERD, hyperlipidemia who has been going to [facility] for his care. Patient recently was treated with systolic dysfunction with EF in the region of 20 to 30%.  Patient did much better.  He has lost about 8 pounds of fluids.  He however continues to have significant shortness of breath with wheeze.  Patient also noted reaccumulation of his lower extremity edema.  He came to the ER because of worsening shortness of breath.  Patient was found to be wheezing.  Chest x-ray showed no evidence of fluid overload.  BNP is much lower than what it was while at [facility]  Patient admitted with COPD exacerbation.  New events last 24 hours / Subjective: Numerous complaints today. He tells me about his chronic back pain, had vertebral fracture last year and has had continued pain. The other day, he could hardly get out of bed. He is also concerned regarding his COPD and CHF. States that "all they tell me is COPD, COPD, COPD, COPD but can't figure it out."   Assessment & Plan:   Principal Problem:   COPD with acute exacerbation (HCC) Active Problems:   Essential hypertension   HLD (hyperlipidemia)   GERD (gastroesophageal reflux disease)   Tobacco abuse   Back pain   Chronic systolic CHF (congestive heart failure) (HCC)   CAD (coronary artery disease)   Acute on chronic hypoxemic respiratory failure Requires 2 L nasal cannula O2 nightly, currently on [place] O2 during the day which is increased from his baseline  Continue to wean down as able  Acute COPD exacerbation Continue IV steroids, nebulizer, doxycycline   Chronic systolic heart failure Patient with mild edema of bilateral ankles. BNP 492. Chest x-ray reviewed  independently, shows hyperinflated lungs without focal consolidation or overt pulmonary edema  Records from Elissa Hefty (NP cardiology at [facility]) reviewed. He had been admitted at [facility] in Jan 2020, BNP at that time was >8000. He was started on torsemide 5mg  QD and spironolactone 12.5mg  QOD  Do not see acute need for cardiology consultation in hospital, continue to monitor. Continue coreg, torsemide, spironolactone and monitor fluid status   CAD Continue aspirin  Essential hypertension Continue lisinopril   Hyperlipidemia Continue pravachol   Chronic low back pain Patient states that he could hardly get out of bed the other day.  He is on chronic steroids.  Lumbar MRI ordered to rule out compression fracture PT OT   DVT prophylaxis: Lovenox Code Status: Full code Family Communication: No family at bedside Disposition Plan: Pending further respiratory improvement   Patient to remain inpatient today. He is admitted for acute on chronic hypoxemic respiratory failure, increased O2 requirements from his baseline needs. On exam, his lungs are quite diminished and moving little air despite treatment. He will require further treatments with IV steroids, neb treatments, antibiotics. MRI lumbar spine pending due to continued back pain.    Consultants:   None  Procedures:   None  Antimicrobials:  Anti-infectives (From admission, onward)   Start     Dose/Rate Route Frequency Ordered Stop   09/25/18 2115  levofloxacin (LEVAQUIN) IVPB 750 mg     750 mg 100 mL/hr over 90 Minutes Intravenous Every 24 hours 09/25/18 2110  Objective: Vitals:   09/26/18 1950 09/27/18 0000 09/27/18 0741 09/27/18 0743  BP:  (!) 114/57  126/64  Pulse:  63  84  Resp:  18  18  Temp:  97.6 F (36.4 C)  97.6 F (36.4 C)  TempSrc:  Oral  Oral  SpO2: 92% 95% 93% 94%  Weight:      Height:        Intake/Output Summary (Last 24 hours) at 09/27/2018 1043 Last data filed at  09/27/2018 0715 Gross per 24 hour  Intake 700 ml  Output 950 ml  Net -250 ml   Filed Weights   09/25/18 1648 09/25/18 2054  Weight: 57.9 kg 56.6 kg    Examination: General exam: Appears calm and comfortable  Respiratory system: Diminished breath sounds bilaterally, tripod position, on [place] O2 and SpO2 falls to 70s with conversation  Cardiovascular system: S1 & S2 heard, RRR. No JVD, murmurs, rubs, gallops or clicks. Trace pedal edema. Gastrointestinal system: Abdomen is nondistended, soft and nontender. No organomegaly or masses felt. Normal bowel sounds heard. Central nervous system: Alert and oriented. No focal neurological deficits. Extremities: Symmetric 5 x 5 power. Skin: No rashes, lesions or ulcers Psychiatry: Judgement and insight appear normal. Mood & affect appropriate.     Data Reviewed: I have personally reviewed following labs and imaging studies  CBC: Recent Labs  Lab 09/25/18 1506 09/25/18 2155 09/26/18 0240  WBC 9.3 9.5 8.4  HGB 14.9 14.2 15.1  HCT 46.8 44.4 46.5  MCV 98.3 98.2 98.1  PLT 259 260 226   Basic Metabolic Panel: Recent Labs  Lab 09/25/18 1506 09/25/18 2155 09/26/18 0240 09/27/18 0232  NA 139  --  138 138  K 4.1  --  4.1 4.4  CL 101  --  102 98  CO2 33*  --  26 31  GLUCOSE 103*  --  111* 181*  BUN 21  --  17 28*  CREATININE 0.77 0.78 0.71 1.09  CALCIUM 8.9  --  9.1 9.1   GFR: Estimated Creatinine Clearance: 52.6 mL/min (by C-G formula based on SCr of 1.09 mg/dL). Liver Function Tests: Recent Labs  Lab 09/25/18 1506 09/26/18 0240  AST 22 22  ALT 19 17  ALKPHOS 37* 38  BILITOT 0.6 1.0  PROT 6.4* 6.4*  ALBUMIN 3.6 3.5   No results for input(s): LIPASE, AMYLASE in the last 168 hours. No results for input(s): AMMONIA in the last 168 hours. Coagulation Profile: No results for input(s): INR, PROTIME in the last 168 hours. Cardiac Enzymes: No results for input(s): CKTOTAL, CKMB, CKMBINDEX, TROPONINI in the last 168 hours. BNP  (last 3 results) No results for input(s): PROBNP in the last 8760 hours. HbA1C: No results for input(s): HGBA1C in the last 72 hours. CBG: No results for input(s): GLUCAP in the last 168 hours. Lipid Profile: No results for input(s): CHOL, HDL, LDLCALC, TRIG, CHOLHDL, LDLDIRECT in the last 72 hours. Thyroid Function Tests: No results for input(s): TSH, T4TOTAL, FREET4, T3FREE, THYROIDAB in the last 72 hours. Anemia Panel: No results for input(s): VITAMINB12, FOLATE, FERRITIN, TIBC, IRON, RETICCTPCT in the last 72 hours. Sepsis Labs: No results for input(s): PROCALCITON, LATICACIDVEN in the last 168 hours.  No results found for this or any previous visit (from the past 240 hour(s)).     Radiology Studies: Dg Chest 2 View  Result Date: 09/25/2018 CLINICAL DATA:  Chest pain EXAM: CHEST - 2 VIEW COMPARISON:  08/25/2018 chest radiograph. FINDINGS: Surgical hardware from [facility]CD[facility] overlies the lower  cervical spine. Stable cardiomediastinal silhouette with normal heart size. No pneumothorax. No pleural effusion. Emphysema. Hyperinflated lungs. Mild scarring at the left lung base, stable. No acute consolidative airspace disease. No pulmonary edema. Surgical clips in the left upper quadrant of the abdomen. IMPRESSION: 1. Emphysema and hyperinflated lungs, suggesting COPD. 2. Stable mild scarring at the left lung base. No acute cardiopulmonary disease. Electronically Signed   By: Delbert Phenix M.D.   On: 09/25/2018 16:20      Scheduled Meds: . aspirin EC  81 mg Oral Daily  . carvedilol  25 mg Oral BID WC  . enoxaparin (LOVENOX) injection  40 mg Subcutaneous Q24H  . ipratropium-albuterol  3 mL Nebulization QID  . lisinopril  40 mg Oral Daily  . methylPREDNISolone (SOLU-MEDROL) injection  60 mg Intravenous Q12H  . nicotine  21 mg Transdermal Daily  . pantoprazole  40 mg Oral Daily  . pravastatin  20 mg Oral QHS  . sodium chloride flush  3 mL Intravenous Once  . [START ON 09/28/2018] spironolactone   12.5 mg Oral QODAY  . torsemide  5 mg Oral Daily   Continuous Infusions: . levofloxacin (LEVAQUIN) IV 750 mg (09/26/18 2228)     LOS: 2 days    Time spent: 40 minutes   Noralee Stain, DO [facility] [email or web address] 09/27/2018, 10:43 AM

## 2018-09-27 NOTE — Progress Notes (Signed)
Physical Therapy Treatment Patient Details Name: Gabriel Gibbs MRN: [id number] DOB: May 10, 1951 Today's Date: 09/27/2018    History of Present Illness  Gabriel Gibbs is a 68 y.o. male with medical history significant of systolic dysfunction CHF, hypertension, coronary artery disease, GERD, chronic back pain with L3 compression fx, hyperlipidemia and COPD who has been going to [facility] for his care. He presented with SOB and admitted for acute exacerbation of COPD.    PT Comments    Patient progressing with ambulation already in hallway at nurses station when initiating PT.  Patient with two coughing spells productive of whitish sputum and noting desat and increased HR during episode and c/o severe upper back pain assisted with splinting at pt request each time triggered after physical exertion (walking back to room and sit<>stand for LE strengthening.)  Continue to feel he would benefit from outpatient pulmonary rehab for maximizing lung function and for education on how to deal with these episodes.      Follow Up Recommendations  Other (comment)(consider pulmonary rehab prior to outpatient PT)     Equipment Recommendations  None recommended by PT    Recommendations for Other Services       Precautions / Restrictions Precautions Precautions: Fall    Mobility  Bed Mobility Overal bed mobility: Modified Independent                Transfers Overall transfer level: Modified independent Equipment used: None                Ambulation/Gait Ambulation/Gait assistance: Min guard;Supervision Gait Distance (Feet): 65 Feet Assistive device: None Gait Pattern/deviations: Step-through pattern;Wide base of support;Drifts right/left     General Gait Details: was already standing at nursing station leaning on counter, walked back to room with one LOB near doorway reaching to door frame for support   Stairs             Wheelchair Mobility    Modified  [place] (Stroke Patients Only)       Balance                                            Cognition Arousal/Alertness: Awake/alert Behavior During Therapy: WFL for tasks assessed/performed Overall Cognitive Status: Within Functional Limits for tasks assessed                                        Exercises Other Exercises Other Exercises: performed sit to stand no UE support x 5 Other Exercises: performed seated scapular squeezed x 5 w/5 sec hold    General Comments General comments (skin integrity, edema, etc.): coughing spell in room after ambulation with SpO2 77%, HR 126, normalized with O2 re-applied and rest down to 90bpm and 93%      Pertinent Vitals/Pain Faces Pain Scale: Hurts even more Pain Location: lower back on R and mid back  Pain Intervention(s): Monitored during session;Repositioned;Other (comment)(splinted)    Home Living                      Prior Function            PT Goals (current goals can now be found in the care plan section) Progress towards PT goals: Progressing toward goals  Frequency    Min 3X/week      PT Plan Current plan remains appropriate    Co-evaluation              AM-PAC PT "6 Clicks" Mobility   Outcome Measure  Help needed turning from your back to your side while in a flat bed without using bedrails?: None Help needed moving from lying on your back to sitting on the side of a flat bed without using bedrails?: None Help needed moving to and from a bed to a chair (including a wheelchair)?: None Help needed standing up from a chair using your arms (e.g., wheelchair or bedside chair)?: None Help needed to walk in hospital room?: A Little Help needed climbing 3-5 steps with a railing? : A Little 6 Click Score: 22    End of Session Equipment Utilized During Treatment: Oxygen Activity Tolerance: Patient limited by fatigue Patient left: with call bell/phone within reach;in  chair   PT Visit Diagnosis: Other abnormalities of gait and mobility (R26.89)     Time: 2863-8177 PT Time Calculation (min) (ACUTE ONLY): 29 min  Charges:  $Gait Training: 8-22 mins $Therapeutic Exercise: 8-22 mins                     Sheran Lawless, PT Acute Rehabilitation Services [phone] 09/27/2018    Gabriel Gibbs 09/27/2018, 5:33 PM

## 2018-09-27 NOTE — Clinical Social Work Note (Signed)
CSW acknowledges consult for "difficulty affording meds." Please consult RNCM with any med assistance needs.  [place], [place] [phone]

## 2018-09-27 NOTE — Progress Notes (Signed)
MD notified of patients HR dropping down into the 30'S while asleep. Patient received Ativan prior to MRI. No new orders at this time. Coreg discontinued. Patient arouses and HR improves Will continue to monitor, other VS are stable at this time.

## 2018-09-27 NOTE — Progress Notes (Signed)
Pt son called RN stating "I have reason to believe my dad has his own Percocet in the room and taking it." Pt son then spoke with his sister Lurena Joiner) who is in the room at this time to make her aware. RN then went into pt room and looked on tray table and saw a small white pill that has been halfed. Pt stated "That's my percocet and I've been taking it since you all will not give me pain medication". RN removed pill from pt hand. Pt very upset and yelling that he is going to leave and that we are not doing anything for him. Pt also states "that was my last pill, give it back". RN educated the pt on the importance of not taking his on medication while taking medication that's given to him here in the hospital. RN spoke with daughter and asked if she could look through his belongings and make sure he doesn't have any more pills that he could possibly take. Pt  Daughter found BC powder and unknown pills in a pill bottle in pt belongings bag.  Merdis Delay, NP called and notified. No orders given at this time. The half of pill was wasted in Psychologist, sport and exercise. Will send other medication to security.  Will continue to monitor.

## 2018-09-28 ENCOUNTER — Inpatient Hospital Stay (HOSPITAL_COMMUNITY): Payer: Medicare HMO

## 2018-09-28 DIAGNOSIS — I5022 Chronic systolic (congestive) heart failure: Secondary | ICD-10-CM

## 2018-09-28 DIAGNOSIS — K219 Gastro-esophageal reflux disease without esophagitis: Secondary | ICD-10-CM

## 2018-09-28 DIAGNOSIS — Z720 Tobacco use: Secondary | ICD-10-CM

## 2018-09-28 DIAGNOSIS — N179 Acute kidney failure, unspecified: Secondary | ICD-10-CM

## 2018-09-28 DIAGNOSIS — I10 Essential (primary) hypertension: Secondary | ICD-10-CM

## 2018-09-28 DIAGNOSIS — E785 Hyperlipidemia, unspecified: Secondary | ICD-10-CM

## 2018-09-28 LAB — BASIC METABOLIC PANEL
Anion gap: 9 (ref 5–15)
BUN: 42 mg/dL — AB (ref 8–23)
CO2: 31 mmol/L (ref 22–32)
Calcium: 9.1 mg/dL (ref 8.9–10.3)
Chloride: 97 mmol/L — ABNORMAL LOW (ref 98–111)
Creatinine, Ser: 1.29 mg/dL — ABNORMAL HIGH (ref 0.61–1.24)
GFR calc Af Amer: >60 mL/min (ref >60)
GFR calc non Af Amer: 57 mL/min — ABNORMAL LOW (ref >60)
GLUCOSE: 163 mg/dL — AB (ref 70–99)
Potassium: 4.8 mmol/L (ref 3.5–5.1)
Sodium: 137 mmol/L (ref 135–145)

## 2018-09-28 LAB — CBC
HCT: 41.6 % (ref 39.0–52.0)
Hemoglobin: 13.6 g/dL (ref 13.0–17.0)
MCH: 32.1 pg (ref 26.0–34.0)
MCHC: 32.7 g/dL (ref 30.0–36.0)
MCV: 98.1 fL (ref 80.0–100.0)
Platelets: 254 10*3/uL (ref 150–400)
RBC: 4.24 MIL/uL (ref 4.22–5.81)
RDW: 14.7 % (ref 11.5–15.5)
WBC: 15.7 10*3/uL — ABNORMAL HIGH (ref 4.0–10.5)
nRBC: 0 % (ref 0.0–0.2)

## 2018-09-28 LAB — GLUCOSE, CAPILLARY: Glucose-Capillary: 183 mg/dL — ABNORMAL HIGH (ref 70–99)

## 2018-09-28 MED ORDER — METHYLPREDNISOLONE SODIUM SUCC 40 MG IJ SOLR
40.0000 mg | Freq: Three times a day (TID) | INTRAMUSCULAR | Status: DC
  Administered 2018-09-28 – 2018-09-29 (×4): 40 mg via INTRAVENOUS
  Filled 2018-09-28 (×4): qty 1

## 2018-09-28 MED ORDER — HYDROCOD POLST-CPM POLST ER 10-8 MG/5ML PO SUER
5.0000 mL | Freq: Two times a day (BID) | ORAL | Status: DC
  Administered 2018-09-28 – 2018-09-29 (×3): 5 mL via ORAL
  Filled 2018-09-28 (×3): qty 5

## 2018-09-28 MED ORDER — ASPIRIN EC 81 MG PO TBEC
81.0000 mg | DELAYED_RELEASE_TABLET | Freq: Every day | ORAL | Status: DC
  Administered 2018-09-29: 81 mg via ORAL
  Filled 2018-09-28: qty 1

## 2018-09-28 MED ORDER — CARVEDILOL 12.5 MG PO TABS
12.5000 mg | ORAL_TABLET | Freq: Two times a day (BID) | ORAL | Status: DC
  Administered 2018-09-28: 12.5 mg via ORAL
  Filled 2018-09-28 (×2): qty 1

## 2018-09-28 MED ORDER — DM-GUAIFENESIN ER 30-600 MG PO TB12
1.0000 | ORAL_TABLET | Freq: Two times a day (BID) | ORAL | Status: DC
  Administered 2018-09-28 – 2018-09-29 (×3): 1 via ORAL
  Filled 2018-09-28 (×3): qty 1

## 2018-09-28 MED ORDER — SODIUM CHLORIDE 0.9 % IV BOLUS
250.0000 mL | Freq: Once | INTRAVENOUS | Status: AC
  Administered 2018-09-28: 250 mL via INTRAVENOUS

## 2018-09-28 MED ORDER — IPRATROPIUM-ALBUTEROL 0.5-2.5 (3) MG/3ML IN SOLN: 3.0000 mL | Freq: Four times a day (QID) | RESPIRATORY_TRACT | Status: DC

## 2018-09-28 MED ORDER — LISINOPRIL 40 MG PO TABS
40.0000 mg | ORAL_TABLET | Freq: Every day | ORAL | Status: DC
  Administered 2018-09-29: 40 mg via ORAL
  Filled 2018-09-28: qty 1

## 2018-09-28 MED ORDER — METOPROLOL TARTRATE 5 MG/5ML IV SOLN
2.5000 mg | Freq: Once | INTRAVENOUS | Status: AC
  Administered 2018-09-28: 2.5 mg via INTRAVENOUS
  Filled 2018-09-28: qty 5

## 2018-09-28 MED ORDER — NALOXONE HCL 0.4 MG/ML IJ SOLN
0.4000 mg | INTRAMUSCULAR | Status: DC | PRN
  Administered 2018-09-28: 0.4 mg via INTRAVENOUS
  Filled 2018-09-28: qty 1

## 2018-09-28 NOTE — Progress Notes (Signed)
Occupational Therapy Treatment Patient Details Name: Gabriel Gibbs MRN: [id number] DOB: 04-22-1951 Today's Date: 09/28/2018    History of present illness  MCCARTNEY MONTOUR is a 68 y.o. male with medical history significant of systolic dysfunction CHF, hypertension, coronary artery disease, GERD, chronic back pain with L3 compression fx, hyperlipidemia and COPD who has been going to [facility] for his care. He presented with SOB and admitted for acute exacerbation of COPD.   OT comments  Session focused on dynamic standing balance and cardiorespiratory endurance. Pt had several slight LOB during higher level balance activity with min A required to correct. HR spiked to 130 bpm and SpO2 dropped to 88% during activity, seated recovery with cueing for breathing technique. OT will continue to follow acutely.    Follow Up Recommendations  No OT follow up;Supervision - Intermittent    Equipment Recommendations  None recommended by OT       Precautions / Restrictions Precautions Precautions: Fall Precaution Comments: wears O2 when lying down, resting or sleeping Restrictions Weight Bearing Restrictions: No       Mobility Bed Mobility Overal bed mobility: Modified Independent             General bed mobility comments: use of bed features  Transfers Overall transfer level: Needs assistance Equipment used: None Transfers: Sit to/from Stand;Stand Pivot Transfers Sit to Stand: Modified independent (Device/Increase time) Stand pivot transfers: Supervision       General transfer comment: Pt had several slight LOB without the use of AD this session    Balance Overall balance assessment: Needs assistance Sitting-balance support: Feet supported;No upper extremity supported Sitting balance-Leahy Scale: Good     Standing balance support: No upper extremity supported;During functional activity Standing balance-Leahy Scale: Fair Standing balance comment: Pt had LOB during  dynamic stepping activity, min A to correct, delayed righting reactions                           ADL either performed or assessed with clinical judgement   ADL Overall ADL's : Needs assistance/impaired                                     Functional mobility during ADLs: Min guard       Vision   Vision Assessment?: No apparent visual deficits          Cognition Arousal/Alertness: Awake/alert Behavior During Therapy: WFL for tasks assessed/performed Overall Cognitive Status: Within Functional Limits for tasks assessed                                 General Comments: Pt WFL for tasks, however very disgruntled with healthcare in general and any recommendation d/t cost. Required redirection to task several times              General Comments Pt's HR spiked to 130 bpm during stepping activity for ~1 min. SpO2 dropped to 88% each set, requiring extended rest break between.    Pertinent Vitals/ Pain       Pain Intervention(s): Other (comment)(When questioned re pain, said yes but refused to say where)         Frequency  Min 2X/week        Progress Toward Goals  OT Goals(current goals can now be found in the care plan section)  Progress towards OT goals: Progressing toward goals  Acute Rehab OT Goals Patient Stated Goal: to go home OT Goal Formulation: With patient Time For Goal Achievement: 10/10/18 Potential to Achieve Goals: Good  Plan Discharge plan remains appropriate       AM-PAC OT "6 Clicks" Daily Activity     Outcome Measure   Help from another person eating meals?: None Help from another person taking care of personal grooming?: None Help from another person toileting, which includes using toliet, bedpan, or urinal?: A Little Help from another person bathing (including washing, rinsing, drying)?: A Little Help from another person to put on and taking off regular upper body clothing?: A Little Help from  another person to put on and taking off regular lower body clothing?: A Little 6 Click Score: 20    End of Session Equipment Utilized During Treatment: Oxygen  OT Visit Diagnosis: Muscle weakness (generalized) (M62.81);Other abnormalities of gait and mobility (R26.89);Pain   Activity Tolerance Patient limited by fatigue   Patient Left in bed;with call bell/phone within reach;with bed alarm set   Nurse Communication          Time: 4854-6270 OT Time Calculation (min): 15 min  Charges: OT General Charges $OT Visit: 1 Visit OT Treatments $Therapeutic Activity: 8-22 mins   Crissie Reese OTR/L  09/28/2018, 3:19 PM

## 2018-09-28 NOTE — Care Management Important Message (Signed)
Important Message  Patient Details  Name: Gabriel Gibbs MRN: [id number] Date of Birth: Jul 01, 1951   Medicare Important Message Given:  Yes    Dorena Bodo 09/28/2018, 12:05 PM

## 2018-09-28 NOTE — Progress Notes (Signed)
RN noticed pt becoming more lethargic and sleeping hunched over in the bed. Pt HR on the bedside continous pulse ox alarming between 30s-40's. Two RNs manually checked pulse both readings in the 40's. On telemetry monitor HR is 60 and up. Pt difficult to arouse. Charge RN had to sternal rub pt. When woken up pt rambling and confused. Schorr, NP notified. Orders given to give Narcan just in case this is due to pt taking his own medication earlier in the day. Rapid Response made aware.  Narcan given. Pt upset and yelling in the room. HR and BP elevated. Vitals documented. Merdis Delay, NP notified. Rapid Response called to check on pt.  Patient agitated but stable at this time Will continue to monitor

## 2018-09-28 NOTE — Progress Notes (Signed)
PROGRESS NOTE    Gabriel Gibbs   YQM:[id number]  DOB: December 01, 1950  DOA: 09/25/2018 PCP: Quitman Livings, MD   Brief Narrative:  Gabriel Gibbs is a 68 y.o.malewith medical history significant ofchronic systolic dysfunction CHF, hypertension, coronary artery disease, GERD, hyperlipidemia who has been going to [facility] for his care. Patient recently was treated with systolic dysfunction with EF in the region of 20 to 30%. Patient did much better. He has lost about 8 pounds of fluids. He however continues to have significant shortness of breath with wheeze. Patient also noted reaccumulation of his lower extremity edema. He came to the ER because of worsening shortness of breath. Patient was found to be wheezing. Chest x-ray showed no evidence of fluid overload. BNP is much lower than what it was while at [facility]. Patient admitted with COPD exacerbation.   Subjective: States that breathing and coughing are improving.  He is quite concerned about his back pain which she states is quite severe.  He is asking when his MRI will be completed.    Assessment & Plan:   Principal Problem:   COPD with acute exacerbation  -Continue treatment with oxygen, breathing treatments, doxycycline - Change Mucinex DM to twice daily and add Tussionex for his severe cough  Active Problems: Chronic back pain -Continue home doses of narcotics -An MRI has been ordered by my colleague and we are waiting for it to be completed    Essential hypertension -Continue carvedilol but at a lower dose as he was quite bradycardic -Continue lisinopril    GERD (gastroesophageal reflux disease) -Continue Protonix     Tobacco abuse -Continue to counsel regarding quitting  AKI -Possibly due to diuretics in setting of poor oral intake - Hold diuretics-hold off on hydrating with IV fluids due to underlying heart failure   Chronic systolic CHF (congestive heart failure) -Due to AKI, will  hold diuretics today   Time spent in minutes: 35 minutes DVT prophylaxis: Lovenox Code Status: Full code Family Communication: No family at bedside-plan discussed with patient in detail Disposition Plan: Home once COPD exacerbation resolves Consultants:   None Procedures:   None Antimicrobials:  Anti-infectives (From admission, onward)   Start     Dose/Rate Route Frequency Ordered Stop   09/27/18 1200  doxycycline (VIBRA-TABS) tablet 100 mg     100 mg Oral Every 12 hours 09/27/18 1049     09/25/18 2115  levofloxacin (LEVAQUIN) IVPB 750 mg  Status:  Discontinued     750 mg 100 mL/hr over 90 Minutes Intravenous Every 24 hours 09/25/18 2110 09/27/18 1049       Objective: Vitals:   09/28/18 0645 09/28/18 0755 09/28/18 0805 09/28/18 1129  BP: (!) 128/57 123/68    Pulse: 72 74 74 67  Resp:   18 18  Temp:  (!) 97.4 F (36.3 C)    TempSrc:  Oral    SpO2: 100% 96% 96% 97%  Weight:      Height:       No intake or output data in the 24 hours ending 09/28/18 1156 Filed Weights   09/25/18 1648 09/25/18 2054  Weight: 57.9 kg 56.6 kg    Examination: General exam: Appears comfortable  HEENT: PERRLA, oral mucosa moist, no sclera icterus or thrush Respiratory system: Bilateral rhonchi and wheezing, significant cough otherwise normal respiratory effort Cardiovascular system: S1 & S2 heard, RRR.   Gastrointestinal system: Abdomen soft, non-tender, nondistended. Normal bowel sounds. Central nervous system: Alert and oriented. No focal  neurological deficits. Musculoskeletal: Tender along the rib cage posteriorly and anteriorly- no specific tenderness along his spine or paraspinal areas Extremities: No cyanosis, clubbing or edema Skin: No rashes or ulcers Psychiatry:  Mood & affect appropriate.     Data Reviewed: I have personally reviewed following labs and imaging studies  CBC: Recent Labs  Lab 09/25/18 1506 09/25/18 2155 09/26/18 0240 09/28/18 0234  WBC 9.3 9.5 8.4  15.7*  HGB 14.9 14.2 15.1 13.6  HCT 46.8 44.4 46.5 41.6  MCV 98.3 98.2 98.1 98.1  PLT 259 260 226 254   Basic Metabolic Panel: Recent Labs  Lab 09/25/18 1506 09/25/18 2155 09/26/18 0240 09/27/18 0232 09/28/18 0234  NA 139  --  138 138 137  K 4.1  --  4.1 4.4 4.8  CL 101  --  102 98 97*  CO2 33*  --  26 31 31   GLUCOSE 103*  --  111* 181* 163*  BUN 21  --  17 28* 42*  CREATININE 0.77 0.78 0.71 1.09 1.29*  CALCIUM 8.9  --  9.1 9.1 9.1   GFR: Estimated Creatinine Clearance: 44.5 mL/min (A) (by C-G formula based on SCr of 1.29 mg/dL (H)). Liver Function Tests: Recent Labs  Lab 09/25/18 1506 09/26/18 0240  AST 22 22  ALT 19 17  ALKPHOS 37* 38  BILITOT 0.6 1.0  PROT 6.4* 6.4*  ALBUMIN 3.6 3.5   No results for input(s): LIPASE, AMYLASE in the last 168 hours. No results for input(s): AMMONIA in the last 168 hours. Coagulation Profile: No results for input(s): INR, PROTIME in the last 168 hours. Cardiac Enzymes: No results for input(s): CKTOTAL, CKMB, CKMBINDEX, TROPONINI in the last 168 hours. BNP (last 3 results) No results for input(s): PROBNP in the last 8760 hours. HbA1C: No results for input(s): HGBA1C in the last 72 hours. CBG: Recent Labs  Lab 09/28/18 0025  GLUCAP 183*   Lipid Profile: No results for input(s): CHOL, HDL, LDLCALC, TRIG, CHOLHDL, LDLDIRECT in the last 72 hours. Thyroid Function Tests: No results for input(s): TSH, T4TOTAL, FREET4, T3FREE, THYROIDAB in the last 72 hours. Anemia Panel: No results for input(s): VITAMINB12, FOLATE, FERRITIN, TIBC, IRON, RETICCTPCT in the last 72 hours. Urine analysis:    Component Value Date/Time   COLORURINE YELLOW 09/25/2018 1838   APPEARANCEUR CLEAR 09/25/2018 1838   LABSPEC 1.027 09/25/2018 1838   PHURINE 7.0 09/25/2018 1838   GLUCOSEU NEGATIVE 09/25/2018 1838   HGBUR NEGATIVE 09/25/2018 1838   BILIRUBINUR NEGATIVE 09/25/2018 1838   KETONESUR 5 (A) 09/25/2018 1838   PROTEINUR NEGATIVE 09/25/2018 1838    NITRITE NEGATIVE 09/25/2018 1838   LEUKOCYTESUR NEGATIVE 09/25/2018 1838   Sepsis Labs: @LABRCNTIP (procalcitonin:4,lacticidven:4) )No results found for this or any previous visit (from the past 240 hour(s)).       Radiology Studies: No results found.    Scheduled Meds: . [START ON 09/29/2018] aspirin EC  81 mg Oral Daily  . carvedilol  12.5 mg Oral BID WC  . chlorpheniramine-HYDROcodone  5 mL Oral Q12H  . dextromethorphan-guaiFENesin  1 tablet Oral BID  . doxycycline  100 mg Oral Q12H  . enoxaparin (LOVENOX) injection  40 mg Subcutaneous Q24H  . ipratropium-albuterol  3 mL Nebulization QID  . [START ON 09/29/2018] lisinopril  40 mg Oral Daily  . methylPREDNISolone (SOLU-MEDROL) injection  40 mg Intravenous Q8H  . nicotine  21 mg Transdermal Daily  . pantoprazole  40 mg Oral Daily  . pravastatin  20 mg Oral QHS  . sodium  chloride flush  3 mL Intravenous Once   Continuous Infusions:   LOS: 3 days      Calvert Cantor, MD [facility] Pager: [email or web address] Password [facility] 09/28/2018, 11:56 AM

## 2018-09-29 DIAGNOSIS — M549 Dorsalgia, unspecified: Secondary | ICD-10-CM

## 2018-09-29 LAB — BASIC METABOLIC PANEL
Anion gap: 8 (ref 5–15)
BUN: 35 mg/dL — AB (ref 8–23)
CO2: 31 mmol/L (ref 22–32)
Calcium: 9.3 mg/dL (ref 8.9–10.3)
Chloride: 100 mmol/L (ref 98–111)
Creatinine, Ser: 1 mg/dL (ref 0.61–1.24)
GFR calc Af Amer: >60 mL/min (ref >60)
GFR calc non Af Amer: >60 mL/min (ref >60)
Glucose, Bld: 157 mg/dL — ABNORMAL HIGH (ref 70–99)
Potassium: 4.7 mmol/L (ref 3.5–5.1)
Sodium: 139 mmol/L (ref 135–145)

## 2018-09-29 LAB — CBC
HCT: 44.3 % (ref 39.0–52.0)
Hemoglobin: 13.9 g/dL (ref 13.0–17.0)
MCH: 31.4 pg (ref 26.0–34.0)
MCHC: 31.4 g/dL (ref 30.0–36.0)
MCV: 100 fL (ref 80.0–100.0)
PLATELETS: 259 10*3/uL (ref 150–400)
RBC: 4.43 MIL/uL (ref 4.22–5.81)
RDW: 14.9 % (ref 11.5–15.5)
WBC: 15.9 10*3/uL — ABNORMAL HIGH (ref 4.0–10.5)
nRBC: 0 % (ref 0.0–0.2)

## 2018-09-29 MED ORDER — BENZONATATE 100 MG PO CAPS: 100.0000 mg | ORAL_CAPSULE | Freq: Three times a day (TID) | ORAL | 0 refills | Status: AC | PRN

## 2018-09-29 MED ORDER — PREDNISONE 10 MG PO TABS: 60.0000 mg | ORAL_TABLET | Freq: Every day | ORAL | 0 refills | Status: AC

## 2018-09-29 MED ORDER — PANTOPRAZOLE SODIUM 40 MG PO TBEC: 40.0000 mg | DELAYED_RELEASE_TABLET | Freq: Every day | ORAL | Status: AC

## 2018-09-29 MED ORDER — DOXYCYCLINE HYCLATE 100 MG PO TABS: 100.0000 mg | ORAL_TABLET | Freq: Two times a day (BID) | ORAL | 0 refills | Status: DC

## 2018-09-29 MED ORDER — DOXYCYCLINE HYCLATE 100 MG PO TABS: 100.0000 mg | ORAL_TABLET | Freq: Two times a day (BID) | ORAL | 0 refills | Status: AC

## 2018-09-29 MED ORDER — HYDROCOD POLST-CPM POLST ER 10-8 MG/5ML PO SUER: 5.0000 mL | Freq: Two times a day (BID) | ORAL | 0 refills | Status: DC

## 2018-09-29 MED ORDER — BUDESONIDE-FORMOTEROL FUMARATE 160-4.5 MCG/ACT IN AERO: 2.0000 | INHALATION_SPRAY | Freq: Two times a day (BID) | RESPIRATORY_TRACT | 12 refills | Status: AC

## 2018-09-29 MED ORDER — DM-GUAIFENESIN ER 30-600 MG PO TB12: 1.0000 | ORAL_TABLET | Freq: Two times a day (BID) | ORAL | 0 refills | Status: AC | PRN

## 2018-09-29 MED ORDER — METOPROLOL TARTRATE 25 MG PO TABS: 25.0000 mg | ORAL_TABLET | Freq: Two times a day (BID) | ORAL | 0 refills | Status: AC

## 2018-09-29 MED ORDER — HYDROCOD POLST-CPM POLST ER 10-8 MG/5ML PO SUER: 5.0000 mL | Freq: Two times a day (BID) | ORAL | 0 refills | Status: AC

## 2018-09-29 MED ORDER — METOPROLOL TARTRATE 25 MG PO TABS
25.0000 mg | ORAL_TABLET | Freq: Two times a day (BID) | ORAL | Status: DC
  Administered 2018-09-29: 25 mg via ORAL
  Filled 2018-09-29: qty 1

## 2018-09-29 MED FILL — DOXYCYCLINE HYCLATE 100 MG: 100 | 5 days supply | Qty: 10 | Fill #0

## 2018-09-29 MED FILL — predniSONE 10 MG TABS: 10 | 21 days supply | Qty: 21 | Fill #0

## 2018-09-29 MED FILL — HYDROCODONE-CHLORPHEN ER SU: 10-8 | 7 days supply | Qty: 140 | Fill #0

## 2018-09-29 MED FILL — METOPROLOL TARTRATE 25 MG T: 25 | 30 days supply | Qty: 60 | Fill #0

## 2018-09-29 NOTE — Progress Notes (Signed)
Physical Therapy Treatment Patient Details Name: Gabriel Gibbs MRN: [id number] DOB: 1951-03-04 Today's Date: 09/29/2018    History of Present Illness  Gabriel Gibbs is a 68 y.o. male with medical history significant of systolic dysfunction CHF, hypertension, coronary artery disease, GERD, chronic back pain with L3 compression fx, hyperlipidemia and COPD who has been going to [facility] for his care. He presented with SOB and admitted for acute exacerbation of COPD.    PT Comments    Patient ambulating without AD, mild imbalance, desat on 2L with activity to 85%, rec 3L with activity. Discussed benefits of pulmonary rehab with patient, pt agreeable but has concerns with payment/coverage.     Follow Up Recommendations  Other (comment) pulmonary rehab before OP PT      Equipment Recommendations  None recommended by PT    Recommendations for Other Services       Precautions / Restrictions Precautions Precautions: Fall Precaution Comments: wears O2 when lying down, resting or sleeping Restrictions Weight Bearing Restrictions: No    Mobility  Bed Mobility Overal bed mobility: Modified Independent             General bed mobility comments: use of bed features  Transfers Overall transfer level: Needs assistance Equipment used: None Transfers: Sit to/from Stand;Stand Pivot Transfers Sit to Stand: Modified independent (Device/Increase time) Stand pivot transfers: Supervision       General transfer comment: Pt had several slight LOB without the use of AD this session  Ambulation/Gait Ambulation/Gait assistance: Min guard;Supervision Gait Distance (Feet): 60 Feet Assistive device: None Gait Pattern/deviations: Step-through pattern;Wide base of support;Drifts right/left Gait velocity: decreased   General Gait Details: pt ambulating laps in hosptial room several times, 2L desat to 85%, mild LOB here there x2.    Stairs             Wheelchair  Mobility    Modified Rankin (Stroke Patients Only)       Balance Overall balance assessment: Needs assistance Sitting-balance support: Feet supported;No upper extremity supported Sitting balance-Leahy Scale: Good     Standing balance support: No upper extremity supported;During functional activity Standing balance-Leahy Scale: Fair Standing balance comment: Pt had LOB during dynamic stepping activity, min A to correct, delayed righting reactions                            Cognition Arousal/Alertness: Awake/alert Behavior During Therapy: WFL for tasks assessed/performed Overall Cognitive Status: Within Functional Limits for tasks assessed                                 General Comments: Pt WFL for tasks, however very disgruntled with healthcare in general and any recommendation d/t cost. Required redirection to task several times      Exercises      General Comments        Pertinent Vitals/Pain      Home Living                      Prior Function            PT Goals (current goals can now be found in the care plan section) Acute Rehab PT Goals Patient Stated Goal: to go home PT Goal Formulation: With patient Time For Goal Achievement: 10/03/18 Potential to Achieve Goals: Good Progress towards PT goals: Progressing toward goals  Frequency    Min 3X/week      PT Plan Current plan remains appropriate    Co-evaluation              AM-PAC PT "6 Clicks" Mobility   Outcome Measure  Help needed turning from your back to your side while in a flat bed without using bedrails?: None Help needed moving from lying on your back to sitting on the side of a flat bed without using bedrails?: None Help needed moving to and from a bed to a chair (including a wheelchair)?: None Help needed standing up from a chair using your arms (e.g., wheelchair or bedside chair)?: None Help needed to walk in hospital room?: A Little Help  needed climbing 3-5 steps with a railing? : A Little 6 Click Score: 22    End of Session Equipment Utilized During Treatment: Oxygen Activity Tolerance: Patient limited by fatigue Patient left: with call bell/phone within reach;in chair   PT Visit Diagnosis: Other abnormalities of gait and mobility (R26.89)     Time: 1224-8250 PT Time Calculation (min) (ACUTE ONLY): 18 min  Charges:  $Gait Training: 8-22 mins                    Etta Grandchild, PT, DPT Acute Rehabilitation Services Pager: [phone] Office: [phone]     Etta Grandchild 09/29/2018, 11:15 AM

## 2018-09-29 NOTE — Progress Notes (Signed)
Pt stated that a med that was ordered that he was originally told that it would be paid for, pharmacy told him it would not be paid for and will cost him about $150 which pt states he does not have to pay. That paying that would mean the option of that and a roof over his head.

## 2018-09-29 NOTE — Discharge Instructions (Signed)
You will need to use 3 L of O2 when you walk and 2 L when you are not walking. You will need to speak with your doctor at your next visit whether you will need to continue wearing the oxygen at all times. Please do not smoke when wearing oxygen. Also, try to quit smoking completely.  I have spoken with Angelica Chessman, the nurse at your cardiologist's office. Your cardiologist's office will be calling you to schedule a time to pick up the heart monitor. Please take the EKG strips that I have given you to your cardiologist at the next visit.    You were cared for by a hospitalist during your hospital stay. If you have any questions about your discharge medications or the care you received while you were in the hospital after you are discharged, you can call the unit and asked to speak with the hospitalist on call if the hospitalist that took care of you is not available. Once you are discharged, your primary care physician will handle any further medical issues.   Please note that NO REFILLS for any discharge medications will be authorized once you are discharged, as it is imperative that you return to your primary care physician (or establish a relationship with a primary care physician if you do not have one) for your aftercare needs so that they can reassess your need for medications and monitor your lab values.  Please take all your medications with you for your next visit with your Primary MD. Please ask your Primary MD to get all Hospital records sent to his/her office. Please request your Primary MD to go over all hospital test results at the follow up.   If you experience worsening of your admission symptoms, develop shortness of breath, chest pain, suicidal or homicidal thoughts or a life threatening emergency, you must seek medical attention immediately by calling 911 or calling your MD.   Gabriel Gibbs must read the complete instructions/literature along with all the possible adverse reactions/side effects for  all the medicines you take including new medications that have been prescribed to you. Take new medicines after you have completely understood and accpet all the possible adverse reactions/side effects.    Do not drive when taking pain medications or sedatives.     Do not take more than prescribed Pain, Sleep and Anxiety Medications   If you have smoked or chewed Tobacco in the last 2 yrs please stop. Stop any regular alcohol  and or recreational drug use.   Wear Seat belts while driving.

## 2018-09-29 NOTE — Progress Notes (Signed)
Pt discharged to home. No s/s of distress noted.

## 2018-09-29 NOTE — Discharge Summary (Addendum)
Physician Discharge Summary  Gabriel Gibbs UJW:[id number] DOB: Nov 17, 1950 DOA: 09/25/2018  PCP: Gabriel Livings, MD  Admit date: 09/25/2018 Discharge date: 09/29/2018  Admitted From: home Disposition:  home   Recommendations for Outpatient Follow-up:  1. Use 3 L O2 on exertion and 2 L at rest- please wean as able 2. Coreg switched to Metoprolol  3. Will need HHRN ordered for him to ensure medications compliance after he completes outpatient pulmonary rehab  Home Health:  ordered Discharge Condition:  stable   CODE STATUS:  Full code   Diet recommendation:  Heart heatlhy Consultations:  none    Discharge Diagnoses:  Principal Problem:   COPD with acute exacerbation (HCC) Active Problems:   Essential hypertension   HLD (hyperlipidemia)   GERD (gastroesophageal reflux disease)   Tobacco abuse   Back pain   Chronic systolic CHF (congestive heart failure) (HCC)   CAD (coronary artery disease)     Brief Summary: Gabriel Gibbs is a 68 y.o.malewith medical history significant ofchronic systolic dysfunction CHF, hypertension, coronary artery disease, GERD, hyperlipidemia who has been going to [facility] for his care. Patient recently was treated with systolic dysfunction with EF in the region of 20 to 30%. Patient did much better. He has lost about 8 pounds of fluids. He however continues to have significant shortness of breath with wheeze. Patient also noted reaccumulation of his lower extremity edema. He came to the ER because of worsening shortness of breath. Patient was found to be wheezing. Chest x-ray showed no evidence of fluid overload. BNP is much lower than what it was while at [facility]. Patient was admitted with a COPD exacerbation.  Hospital Course:  Principal Problem:   COPD with acute exacerbation with acute bronchitis -Continue treatment with oxygen, breathing treatments, doxycycline - Added Mucinex DM to twice daily and add Tussionex for  his severe cough - he will be discharged home with O2 at a rate of 3 L on exertion and 2 L at rest- he states he typically uses 2 L at home - I have also started Symbicort and have referred him for pulmonary rehab  Active Problems: Acute on Chronic back pain - he complains of pain in his left upper rib cage posteriorly- on exam he is tender in this area and on the right posterior rib cage. His pain radiated anteriorly to his chest wall/ ribs- there is no lower back pain or tenderness on exam -Continue home doses of narcotics -An MRI has been ordered by my colleague which reveals an old L3 compression fracture with severe spinal canal stenosis - this is however asymptomatic and his L3-L4 nerve roots are not affected on physical exam- ie there is no weakness or paresthesias in the nerve root distribution    Essential hypertension- bradycardia -  Carvedilol held as he was quite bradycardic in the hospital with HR dropping into the 40s and 50s -Continued on lisinopril - due to the severity of his COPD exacerbation, I will hold off on resuming his Coreg on discharge but switch him to Metoprolol for now (note A-fib below) - once his acute bronchitis resolves, his Coreg can be resumed    ? A-fib - he had a short run of a what appears to have been A-fib last night but as a full EKG was not done, I cannot confirm that it was A-fib-I have recommended he have an event monitor for the next 30 days to ensure that he does not have underlying A-fib which would  put him at risk for a CVA    GERD (gastroesophageal reflux disease) -Continue Protonix     Tobacco abuse -Continue to counsel regarding quitting  AKI - Cr 0.71> 1.09> 1.29 -Possibly due to diuretics in setting of poor oral intake - Held diuretics yesterday to allow Cr to rise- it has improved to 1.00 today  - I will hold diuretics again today and allow him to resume them tomorrow    Chronic systolic CHF (congestive heart failure) - see  above    Discharge Exam: Vitals:   09/29/18 0625 09/29/18 0722  BP: 136/67 128/65  Pulse:  60  Resp:  18  Temp:  97.7 F (36.5 C)  SpO2:  96%   Vitals:   09/28/18 2359 09/29/18 0536 09/29/18 0625 09/29/18 0722  BP: 125/76  136/67 128/65  Pulse:    60  Resp:    18  Temp:    97.7 F (36.5 C)  TempSrc:    Oral  SpO2: 96% 98%  96%  Weight:      Height:        General: Pt is alert, awake, not in acute distress Cardiovascular: RRR, S1/S2 +, no rubs, no gallops Respiratory: CTA bilaterally, no wheezing, no rhonchi Abdominal: Soft, NT, ND, bowel sounds + Extremities: no edema, no cyanosis   Discharge Instructions  Discharge Instructions    Diet - low sodium heart healthy   Complete by:  As directed    Increase activity slowly   Complete by:  As directed      Allergies as of 09/29/2018   No Known Allergies     Medication List    STOP taking these medications   carvedilol 25 MG tablet Commonly known as:  COREG   cyclobenzaprine 10 MG tablet Commonly known as:  FLEXERIL     TAKE these medications   albuterol 108 (90 Base) MCG/ACT inhaler Commonly known as:  PROVENTIL HFA;VENTOLIN HFA Inhale 2 puffs into the lungs 4 (four) times daily as needed for wheezing.   aspirin EC 81 MG tablet Take 81 mg by mouth daily.   benzonatate 100 MG capsule Commonly known as:  TESSALON Take 1 capsule (100 mg total) by mouth 3 (three) times daily as needed for cough.   budesonide-formoterol 160-4.5 MCG/ACT inhaler Commonly known as:  SYMBICORT Inhale 2 puffs into the lungs 2 (two) times daily.   chlorpheniramine-HYDROcodone 10-8 MG/5ML Suer Commonly known as:  TUSSIONEX Take 5 mLs by mouth every 12 (twelve) hours.   dextromethorphan-guaiFENesin 30-600 MG 12hr tablet Commonly known as:  MUCINEX DM Take 1 tablet by mouth 2 (two) times daily as needed for cough.   doxycycline 100 MG tablet Commonly known as:  VIBRA-TABS Take 1 tablet (100 mg total) by mouth every 12  (twelve) hours.   hydrOXYzine 25 MG capsule Commonly known as:  VISTARIL Take 25 mg by mouth 2 (two) times daily as needed for anxiety.   lisinopril 40 MG tablet Commonly known as:  PRINIVIL,ZESTRIL Take 40 mg by mouth daily.   oxyCODONE-acetaminophen 10-325 MG tablet Commonly known as:  PERCOCET Take 1 tablet by mouth 3 (three) times daily as needed for pain. What changed:  when to take this   pantoprazole 40 MG tablet Commonly known as:  PROTONIX Take 1 tablet (40 mg total) by mouth daily.   pravastatin 20 MG tablet Commonly known as:  PRAVACHOL Take 20 mg by mouth at bedtime.   predniSONE 10 MG tablet Commonly known as:  DELTASONE Take 6  tablets (60 mg total) by mouth daily with breakfast. Take 6 tabs tomorrow and then decrease by 1 tab daily until finished   spironolactone 25 MG tablet Commonly known as:  ALDACTONE Take 25 mg by mouth daily.   tamsulosin 0.4 MG Caps capsule Commonly known as:  FLOMAX Take 0.4 mg by mouth daily.   torsemide 10 MG tablet Commonly known as:  DEMADEX Take 5 mg by mouth daily.   triamcinolone cream 0.1 % Commonly known as:  KENALOG Apply 1 application topically 3 (three) times daily as needed (for itching).       No Known Allergies   Procedures/Studies:    Dg Chest 2 View  Result Date: 09/25/2018 CLINICAL DATA:  Chest pain EXAM: CHEST - 2 VIEW COMPARISON:  08/25/2018 chest radiograph. FINDINGS: Surgical hardware from ACDF overlies the lower cervical spine. Stable cardiomediastinal silhouette with normal heart size. No pneumothorax. No pleural effusion. Emphysema. Hyperinflated lungs. Mild scarring at the left lung base, stable. No acute consolidative airspace disease. No pulmonary edema. Surgical clips in the left upper quadrant of the abdomen. IMPRESSION: 1. Emphysema and hyperinflated lungs, suggesting COPD. 2. Stable mild scarring at the left lung base. No acute cardiopulmonary disease. Electronically Signed   By: Delbert Phenix  M.D.   On: 09/25/2018 16:20   Mr Lumbar Spine Wo Contrast  Result Date: 09/28/2018 CLINICAL DATA:  Back pain.  History of lumbar spine surgery. EXAM: MRI LUMBAR SPINE WITHOUT CONTRAST TECHNIQUE: Multiplanar, multisequence MR imaging of the lumbar spine was performed. No intravenous contrast was administered. COMPARISON:  MRI lumbar spine August 30, 2017 FINDINGS: SEGMENTATION: For the purposes of this report, the last well-formed intervertebral disc is reported as L5-S1. transitional anatomy with sacralized L5 vertebral body. ALIGNMENT: Maintained lumbar lordosis. No malalignment. Levoscoliosis inferred on axial images. VERTEBRAE:Severe old L3 burst fracture with further height loss, 6 mm retropulsed bony fragments. Remaining lumbar vertebral bodies intact. Bright T1 and bright T2 bone to bone marrow signal compatible with osteopenia. No acute or abnormal bone marrow signal. Moderate L4-5 disc height loss is similar with L3-4 through L5-S1 disc desiccation. Congenitally smaller L5-S1 disc. CONUS MEDULLARIS AND CAUDA EQUINA: Conus medullaris terminates at T12-L1 and demonstrates normal morphology and signal characteristics. Central displacement of the cauda equina due to canal stenosis. PARASPINAL AND OTHER SOFT TISSUES: Nonacute. Colonic diverticulosis. DISC LEVELS: T12-L1 and L1-2: No disc bulge, canal stenosis nor neural foraminal narrowing. L2-3: Retropulsed L3 bony fragments resulting in severe canal stenosis, AP dimension of the canal is 5 mm. Narrowed lateral recesses may affect the traversing L3 nerves. Mild-to-moderate bilateral neural foraminal narrowing. L3-4: Small broad-based LEFT subarticular disc protrusion. Minimal facet arthropathy and ligamentum flavum redundancy without canal stenosis. Mild RIGHT and moderate LEFT neural foraminal narrowing. L4-5: Status post LEFT hemilaminectomy. Similar 4 mm central disc protrusion. Mild facet arthropathy and RIGHT ligamentum flavum redundancy. No canal  stenosis. Moderate bilateral neural foraminal narrowing. L5-S1: No disc bulge, canal stenosis nor neural foraminal narrowing. IMPRESSION: 1. No acute osseous process. 2. Old L3 burst fracture with severe height loss.  No malalignment. 3. Severe canal stenosis at L3, narrowed lateral recesses may affect the traversing L3 nerves. 4. Neural foraminal narrowing L2-3 through L4-5: Moderate at L3-4 and L4-5. Electronically Signed   By: Awilda Metro M.D.   On: 09/28/2018 16:54     The results of significant diagnostics from this hospitalization (including imaging, microbiology, ancillary and laboratory) are listed below for reference.     Microbiology: No results found for  this or any previous visit (from the past 240 hour(s)).   Labs: BNP (last 3 results) Recent Labs    09/25/18 1506  BNP 492.2*   Basic Metabolic Panel: Recent Labs  Lab 09/25/18 1506 09/25/18 2155 09/26/18 0240 09/27/18 0232 09/28/18 0234 09/29/18 0300  NA 139  --  138 138 137 139  K 4.1  --  4.1 4.4 4.8 4.7  CL 101  --  102 98 97* 100  CO2 33*  --  26 31 31 31   GLUCOSE 103*  --  111* 181* 163* 157*  BUN 21  --  17 28* 42* 35*  CREATININE 0.77 0.78 0.71 1.09 1.29* 1.00  CALCIUM 8.9  --  9.1 9.1 9.1 9.3   Liver Function Tests: Recent Labs  Lab 09/25/18 1506 09/26/18 0240  AST 22 22  ALT 19 17  ALKPHOS 37* 38  BILITOT 0.6 1.0  PROT 6.4* 6.4*  ALBUMIN 3.6 3.5   No results for input(s): LIPASE, AMYLASE in the last 168 hours. No results for input(s): AMMONIA in the last 168 hours. CBC: Recent Labs  Lab 09/25/18 1506 09/25/18 2155 09/26/18 0240 09/28/18 0234 09/29/18 0300  WBC 9.3 9.5 8.4 15.7* 15.9*  HGB 14.9 14.2 15.1 13.6 13.9  HCT 46.8 44.4 46.5 41.6 44.3  MCV 98.3 98.2 98.1 98.1 100.0  PLT 259 260 226 254 259   Cardiac Enzymes: No results for input(s): CKTOTAL, CKMB, CKMBINDEX, TROPONINI in the last 168 hours. BNP: Invalid input(s): POCBNP CBG: Recent Labs  Lab 09/28/18 0025  GLUCAP  183*   D-Dimer No results for input(s): DDIMER in the last 72 hours. Hgb A1c No results for input(s): HGBA1C in the last 72 hours. Lipid Profile No results for input(s): CHOL, HDL, LDLCALC, TRIG, CHOLHDL, LDLDIRECT in the last 72 hours. Thyroid function studies No results for input(s): TSH, T4TOTAL, T3FREE, THYROIDAB in the last 72 hours.  Invalid input(s): FREET3 Anemia work up No results for input(s): VITAMINB12, FOLATE, FERRITIN, TIBC, IRON, RETICCTPCT in the last 72 hours. Urinalysis    Component Value Date/Time   COLORURINE YELLOW 09/25/2018 1838   APPEARANCEUR CLEAR 09/25/2018 1838   LABSPEC 1.027 09/25/2018 1838   PHURINE 7.0 09/25/2018 1838   GLUCOSEU NEGATIVE 09/25/2018 1838   HGBUR NEGATIVE 09/25/2018 1838   BILIRUBINUR NEGATIVE 09/25/2018 1838   KETONESUR 5 (A) 09/25/2018 1838   PROTEINUR NEGATIVE 09/25/2018 1838   NITRITE NEGATIVE 09/25/2018 1838   LEUKOCYTESUR NEGATIVE 09/25/2018 1838   Sepsis Labs Invalid input(s): PROCALCITONIN,  WBC,  LACTICIDVEN Microbiology No results found for this or any previous visit (from the past 240 hour(s)).   Time coordinating discharge in minutes: 65  SIGNED:   Calvert Cantor, MD  [facility] 09/29/2018, 9:48 AM Pager   If 7PM-7AM, please contact night-coverage [email or web address] Password [email or web address]

## 2018-10-03 ENCOUNTER — Telehealth (HOSPITAL_COMMUNITY): Payer: Self-pay

## 2018-10-03 NOTE — Telephone Encounter (Signed)
Pt contacted to inform of Pulmonary rehab program in [place] at [facility] d/t pt home location. Pt states that he has just left his pulmonary doctor in [place] and his doctor did not mention anything about rehab and that since he did not mention it he is not interested in participating in either program. Referral close d/t pt refusal.   Glade Nurse RN, BSN Cardiac and Pulmonary Rehab RN

## 2019-02-20 DIAGNOSIS — R0902 Hypoxemia: Secondary | ICD-10-CM

## 2019-02-20 DIAGNOSIS — J441 Chronic obstructive pulmonary disease with (acute) exacerbation: Secondary | ICD-10-CM | POA: Diagnosis not present

## 2019-02-20 DIAGNOSIS — I5022 Chronic systolic (congestive) heart failure: Secondary | ICD-10-CM

## 2019-02-20 DIAGNOSIS — I10 Essential (primary) hypertension: Secondary | ICD-10-CM

## 2019-02-20 DIAGNOSIS — D72829 Elevated white blood cell count, unspecified: Secondary | ICD-10-CM

## 2019-02-20 DIAGNOSIS — Z720 Tobacco use: Secondary | ICD-10-CM

## 2019-02-21 DIAGNOSIS — J441 Chronic obstructive pulmonary disease with (acute) exacerbation: Secondary | ICD-10-CM | POA: Diagnosis not present

## 2019-02-21 DIAGNOSIS — I10 Essential (primary) hypertension: Secondary | ICD-10-CM | POA: Diagnosis not present

## 2019-02-21 DIAGNOSIS — I5022 Chronic systolic (congestive) heart failure: Secondary | ICD-10-CM | POA: Diagnosis not present

## 2019-02-21 DIAGNOSIS — R0902 Hypoxemia: Secondary | ICD-10-CM | POA: Diagnosis not present

## 2019-02-22 DIAGNOSIS — I351 Nonrheumatic aortic (valve) insufficiency: Secondary | ICD-10-CM

## 2019-02-22 DIAGNOSIS — R0902 Hypoxemia: Secondary | ICD-10-CM | POA: Diagnosis not present

## 2019-02-22 DIAGNOSIS — I517 Cardiomegaly: Secondary | ICD-10-CM

## 2019-02-22 DIAGNOSIS — I340 Nonrheumatic mitral (valve) insufficiency: Secondary | ICD-10-CM

## 2019-02-22 DIAGNOSIS — I5022 Chronic systolic (congestive) heart failure: Secondary | ICD-10-CM | POA: Diagnosis not present

## 2019-02-22 DIAGNOSIS — J441 Chronic obstructive pulmonary disease with (acute) exacerbation: Secondary | ICD-10-CM | POA: Diagnosis not present

## 2019-02-22 DIAGNOSIS — I10 Essential (primary) hypertension: Secondary | ICD-10-CM | POA: Diagnosis not present

## 2019-02-23 DIAGNOSIS — I5022 Chronic systolic (congestive) heart failure: Secondary | ICD-10-CM | POA: Diagnosis not present

## 2019-02-23 DIAGNOSIS — I10 Essential (primary) hypertension: Secondary | ICD-10-CM | POA: Diagnosis not present

## 2019-02-23 DIAGNOSIS — J441 Chronic obstructive pulmonary disease with (acute) exacerbation: Secondary | ICD-10-CM | POA: Diagnosis not present

## 2019-02-23 DIAGNOSIS — R0902 Hypoxemia: Secondary | ICD-10-CM | POA: Diagnosis not present

## 2019-02-24 DIAGNOSIS — I10 Essential (primary) hypertension: Secondary | ICD-10-CM | POA: Diagnosis not present

## 2019-02-24 DIAGNOSIS — I5022 Chronic systolic (congestive) heart failure: Secondary | ICD-10-CM | POA: Diagnosis not present

## 2019-02-24 DIAGNOSIS — J441 Chronic obstructive pulmonary disease with (acute) exacerbation: Secondary | ICD-10-CM | POA: Diagnosis not present

## 2019-02-24 DIAGNOSIS — R0902 Hypoxemia: Secondary | ICD-10-CM | POA: Diagnosis not present

## 2019-02-25 DIAGNOSIS — I5022 Chronic systolic (congestive) heart failure: Secondary | ICD-10-CM | POA: Diagnosis not present

## 2019-02-25 DIAGNOSIS — I10 Essential (primary) hypertension: Secondary | ICD-10-CM | POA: Diagnosis not present

## 2019-02-25 DIAGNOSIS — J441 Chronic obstructive pulmonary disease with (acute) exacerbation: Secondary | ICD-10-CM | POA: Diagnosis not present

## 2019-02-25 DIAGNOSIS — R0902 Hypoxemia: Secondary | ICD-10-CM | POA: Diagnosis not present

## 2019-02-26 DIAGNOSIS — J441 Chronic obstructive pulmonary disease with (acute) exacerbation: Secondary | ICD-10-CM | POA: Diagnosis not present

## 2019-02-26 DIAGNOSIS — I10 Essential (primary) hypertension: Secondary | ICD-10-CM | POA: Diagnosis not present

## 2019-02-26 DIAGNOSIS — R0902 Hypoxemia: Secondary | ICD-10-CM | POA: Diagnosis not present

## 2019-02-26 DIAGNOSIS — I5022 Chronic systolic (congestive) heart failure: Secondary | ICD-10-CM | POA: Diagnosis not present

## 2019-03-17 DIAGNOSIS — R079 Chest pain, unspecified: Secondary | ICD-10-CM | POA: Diagnosis not present

## 2019-03-17 DIAGNOSIS — F17210 Nicotine dependence, cigarettes, uncomplicated: Secondary | ICD-10-CM | POA: Diagnosis not present

## 2019-03-17 DIAGNOSIS — J441 Chronic obstructive pulmonary disease with (acute) exacerbation: Secondary | ICD-10-CM | POA: Diagnosis not present

## 2019-03-18 DIAGNOSIS — R079 Chest pain, unspecified: Secondary | ICD-10-CM | POA: Diagnosis not present

## 2019-03-18 DIAGNOSIS — J441 Chronic obstructive pulmonary disease with (acute) exacerbation: Secondary | ICD-10-CM | POA: Diagnosis not present

## 2019-03-18 DIAGNOSIS — F17210 Nicotine dependence, cigarettes, uncomplicated: Secondary | ICD-10-CM | POA: Diagnosis not present

## 2019-03-19 DIAGNOSIS — I5023 Acute on chronic systolic (congestive) heart failure: Secondary | ICD-10-CM

## 2019-03-19 DIAGNOSIS — F17210 Nicotine dependence, cigarettes, uncomplicated: Secondary | ICD-10-CM | POA: Diagnosis not present

## 2019-03-19 DIAGNOSIS — R079 Chest pain, unspecified: Secondary | ICD-10-CM | POA: Diagnosis not present

## 2019-03-19 DIAGNOSIS — J441 Chronic obstructive pulmonary disease with (acute) exacerbation: Secondary | ICD-10-CM | POA: Diagnosis not present

## 2019-03-20 DIAGNOSIS — J441 Chronic obstructive pulmonary disease with (acute) exacerbation: Secondary | ICD-10-CM | POA: Diagnosis not present

## 2019-03-20 DIAGNOSIS — R079 Chest pain, unspecified: Secondary | ICD-10-CM | POA: Diagnosis not present

## 2019-03-20 DIAGNOSIS — F17210 Nicotine dependence, cigarettes, uncomplicated: Secondary | ICD-10-CM | POA: Diagnosis not present

## 2019-04-21 IMAGING — CT CT ANGIO CHEST
3 of 7 series · 18 of 36 positions shown · IV contrast (Omni 300)
Comparison: Chest radiograph August 21, 2017 and CT chest Eveline

CLINICAL DATA: Back injury after lifting heavy object today.
History of bronchitis and psoriasis.

EXAM:
CT ANGIOGRAPHY CHEST WITH CONTRAST
TECHNIQUE: Multidetector CT imaging of the chest was performed using the
standard protocol during bolus administration of intravenous
contrast. Multiplanar CT image reconstructions and MIPs were
obtained to evaluate the vascular anatomy.
CONTRAST:  47 cc TSY3DA-YEL IOPAMIDOL (TSY3DA-YEL) INJECTION 76%

[Series 8: pe thins · axial · 0.70mm/px · z∈[-295,-14]mm · 13 of 329 slices shown]
[im 24/329  lung]
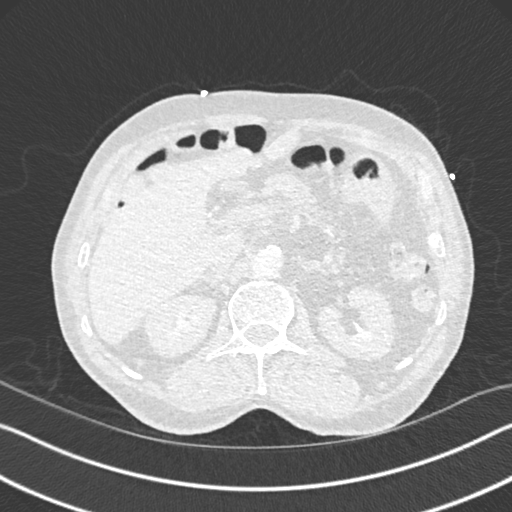
[im 47/329  mediastinal]
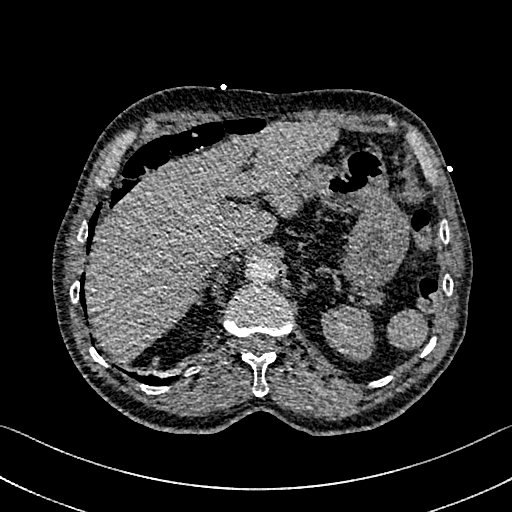
[im 71/329  lung]
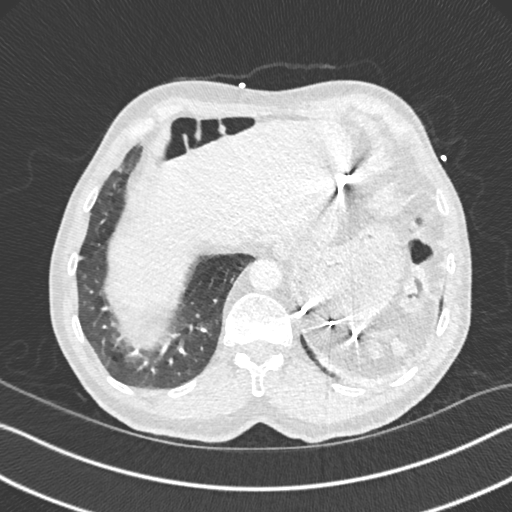
[im 94/329  mediastinal]
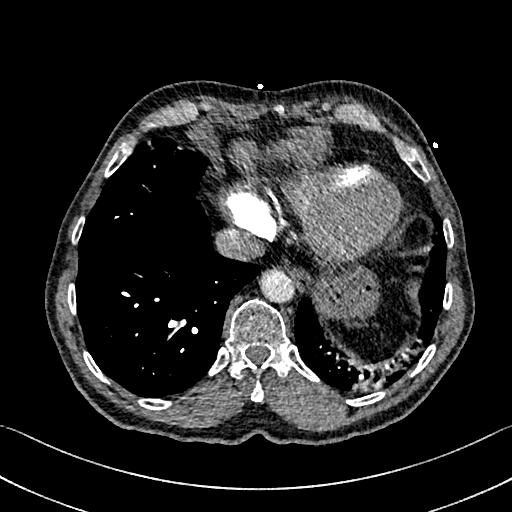
[im 118/329  lung]
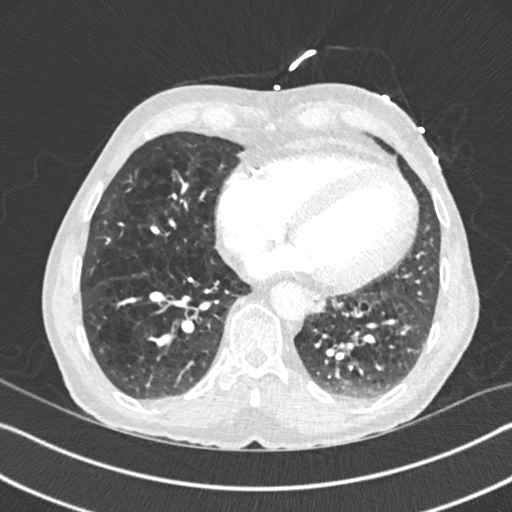
[im 141/329  mediastinal]
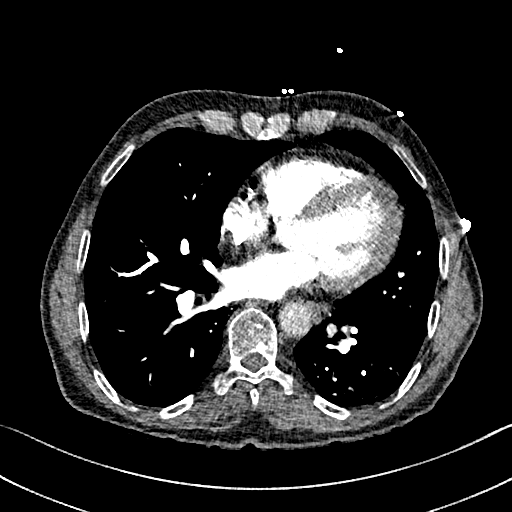
[im 165/329  lung]
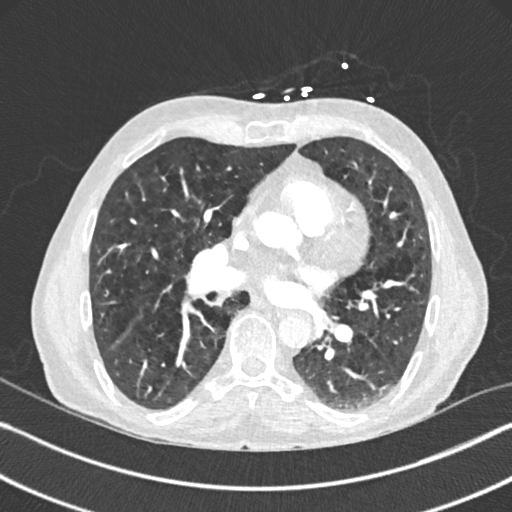
[im 188/329  mediastinal]
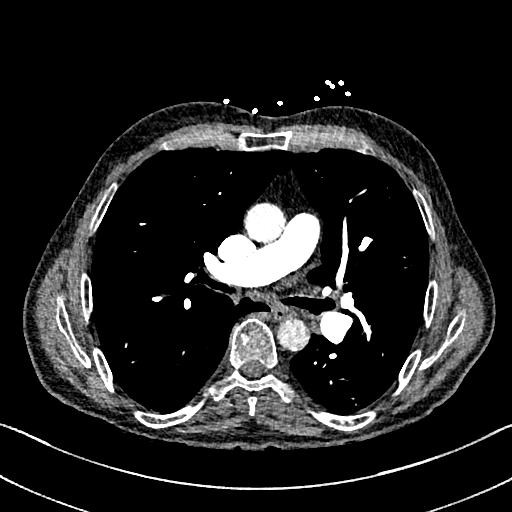
[im 211/329  lung]
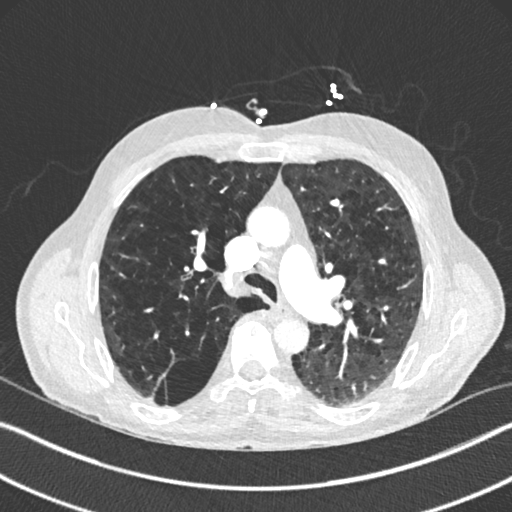
[im 235/329  mediastinal]
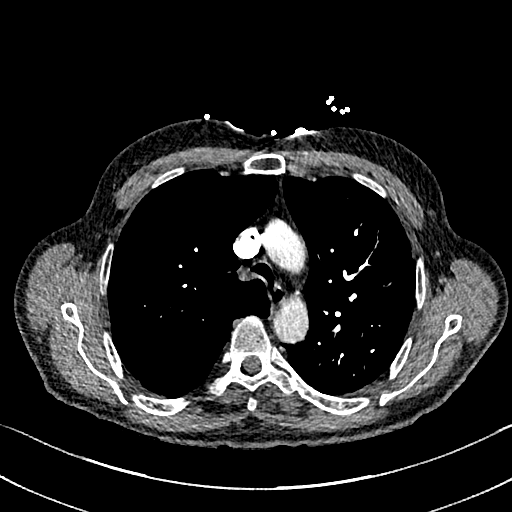
[im 258/329  lung]
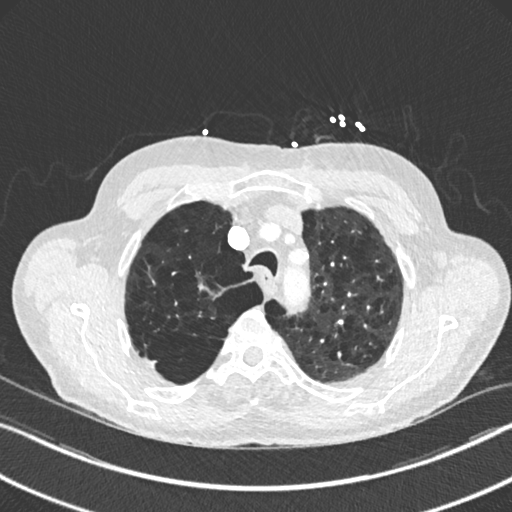
[im 282/329  mediastinal]
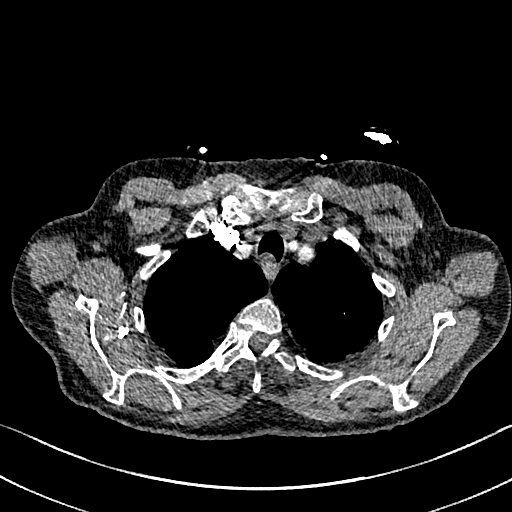
[im 305/329  lung]
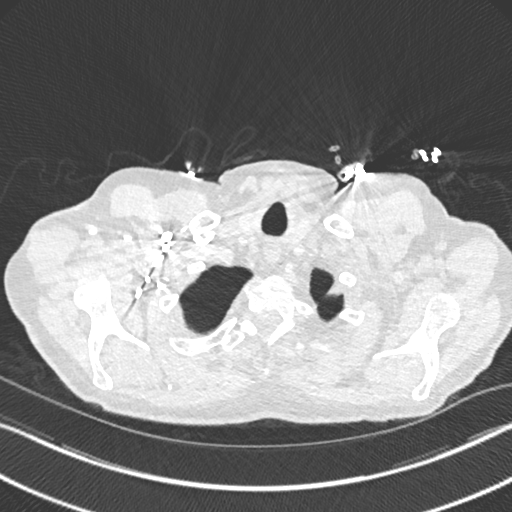

[Series 9: pe lung · axial · 0.64mm/px · z∈[-214,-46]mm · 4 of 141 slices shown]
[im 29/141  mediastinal]
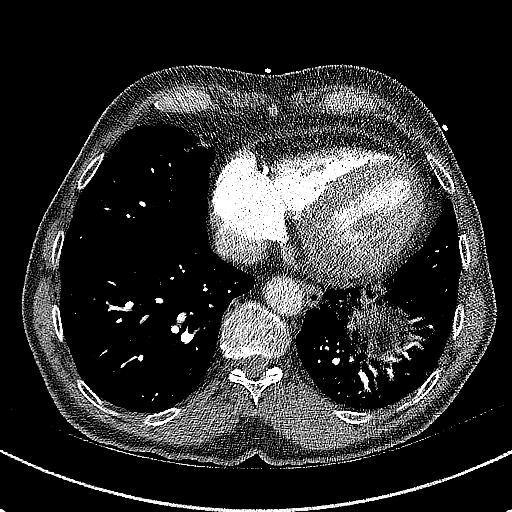
[im 57/141  mediastinal]
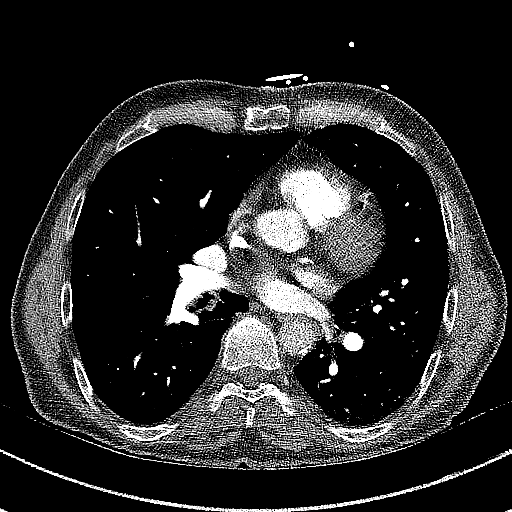
[im 85/141  mediastinal]
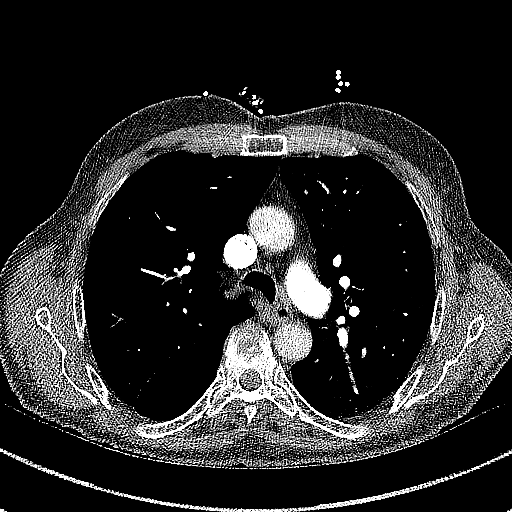
[im 113/141  mediastinal]
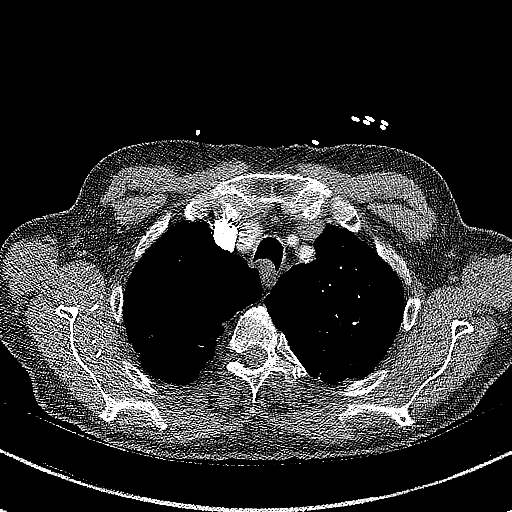

[Series 10: pe 2mm cor · coronal · 0.66mm/px · 1 of 116 slices shown]
[im 58/116  mediastinal]
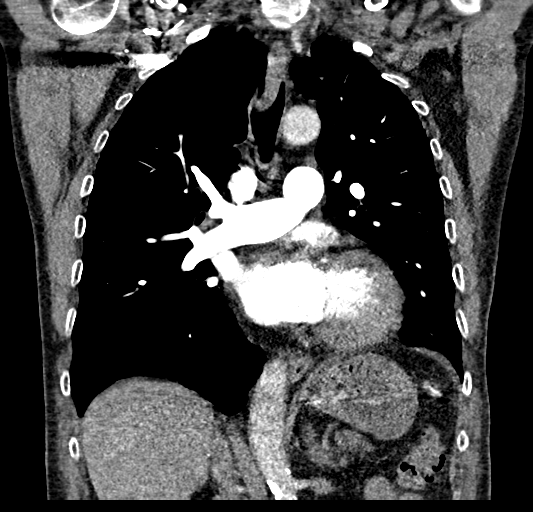

[18 of 36 positions shown; findings below may reference images not displayed]

FINDINGS: CARDIOVASCULAR: Adequate contrast opacification of the pulmonary
artery's. Main pulmonary artery is not enlarged. No pulmonary
arterial filling defects to the level of the subsegmental branches.
Heart size is mildly enlarged, no right heart strain. Trace coronary
artery calcifications. Mild pericardial wall thickening. Thoracic
aorta is normal course and caliber, mild calcific atherosclerosis
aortic arch.

MEDIASTINUM/NODES: No lymphadenopathy by CT size criteria.

LUNGS/PLEURA: Tracheobronchial tree is patent, no pneumothorax. Mild
bronchial wall thickening. Marked biapical bullous changes with
severe centrilobular emphysema. Bibasilar dependent atelectasis.

UPPER ABDOMEN: Status post splenectomy with splenosis. Multiple
surgical clips in upper abdomen. Colonic intra positioning.

MUSCULOSKELETAL: Old moderate to severe T7 compression fracture with
50-75% height loss. Osteopenia. Multilevel Schmorl's nodes.

Review of the MIP images confirms the above findings.
IMPRESSION: 1. No acute pulmonary embolism.
2. Severe emphysema. Bronchial wall thickening seen with reactive
airway disease and bronchitis. No pneumonia.

Aortic Atherosclerosis (50P0Q-R3V.V) and Emphysema (50P0Q-VG1.0).

## 2019-04-21 IMAGING — DX DG CHEST 2V
3 series · 3 of 3 positions shown · non-contrast
Comparison: January 23, 2017

CLINICAL DATA: Center and right lumbar pain after lifting heavy
break drum.

EXAM:
CHEST  2 VIEW

[chest lat (1 of 2)]
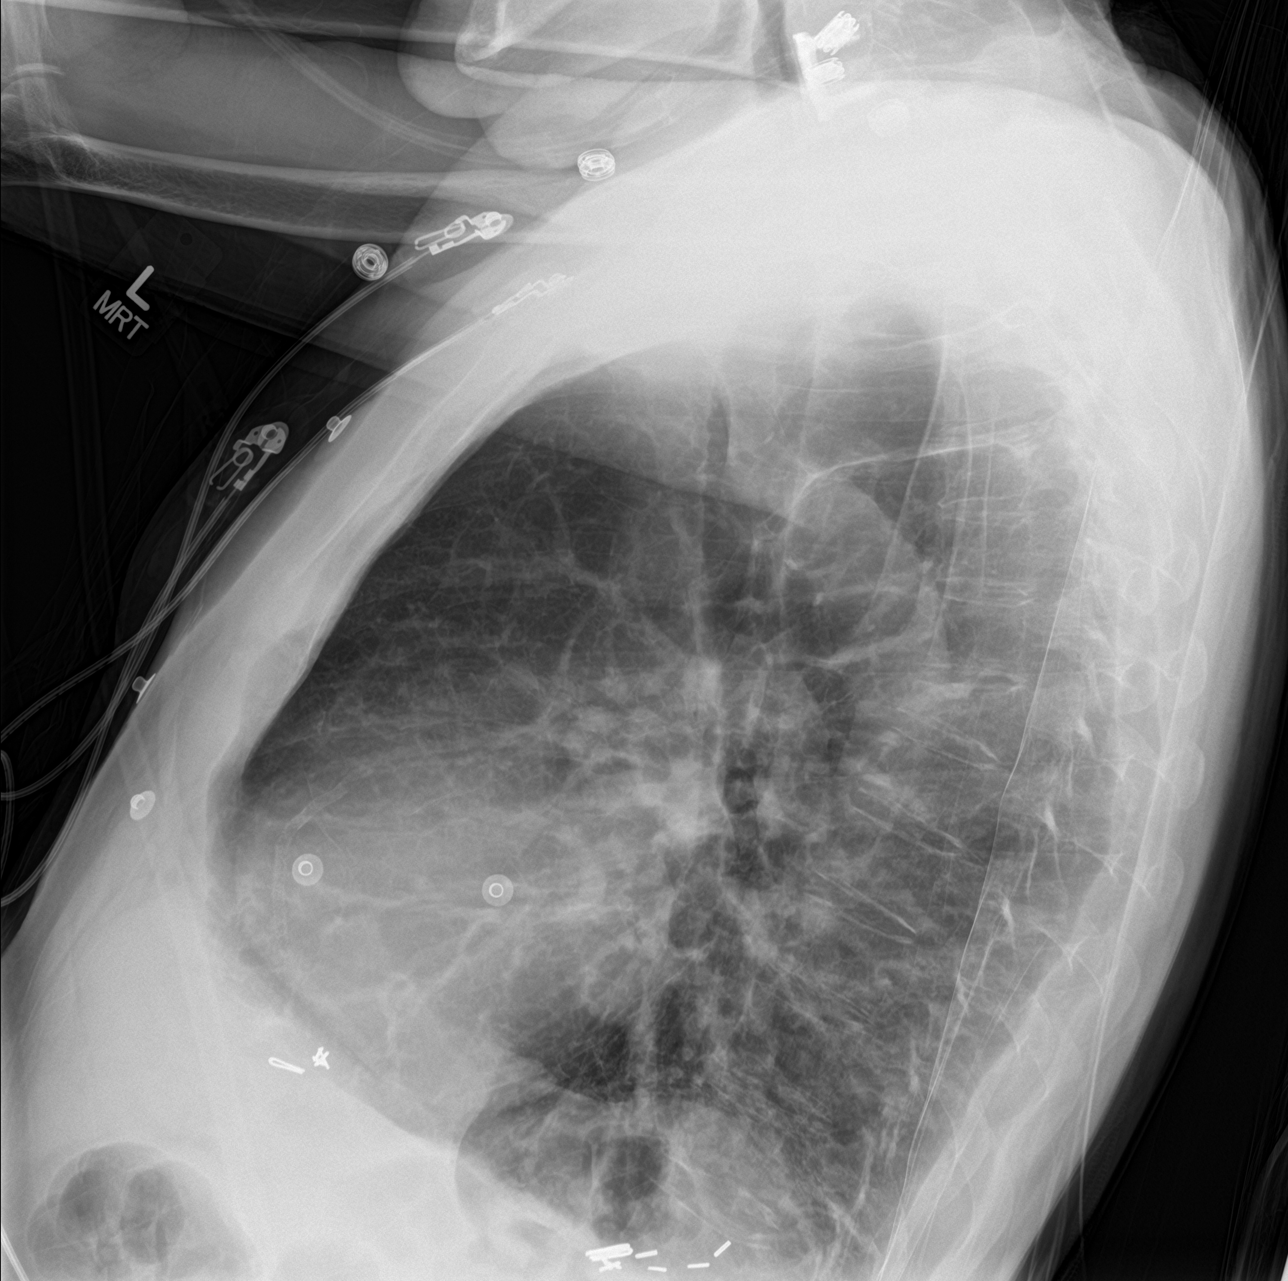

[chest ap]
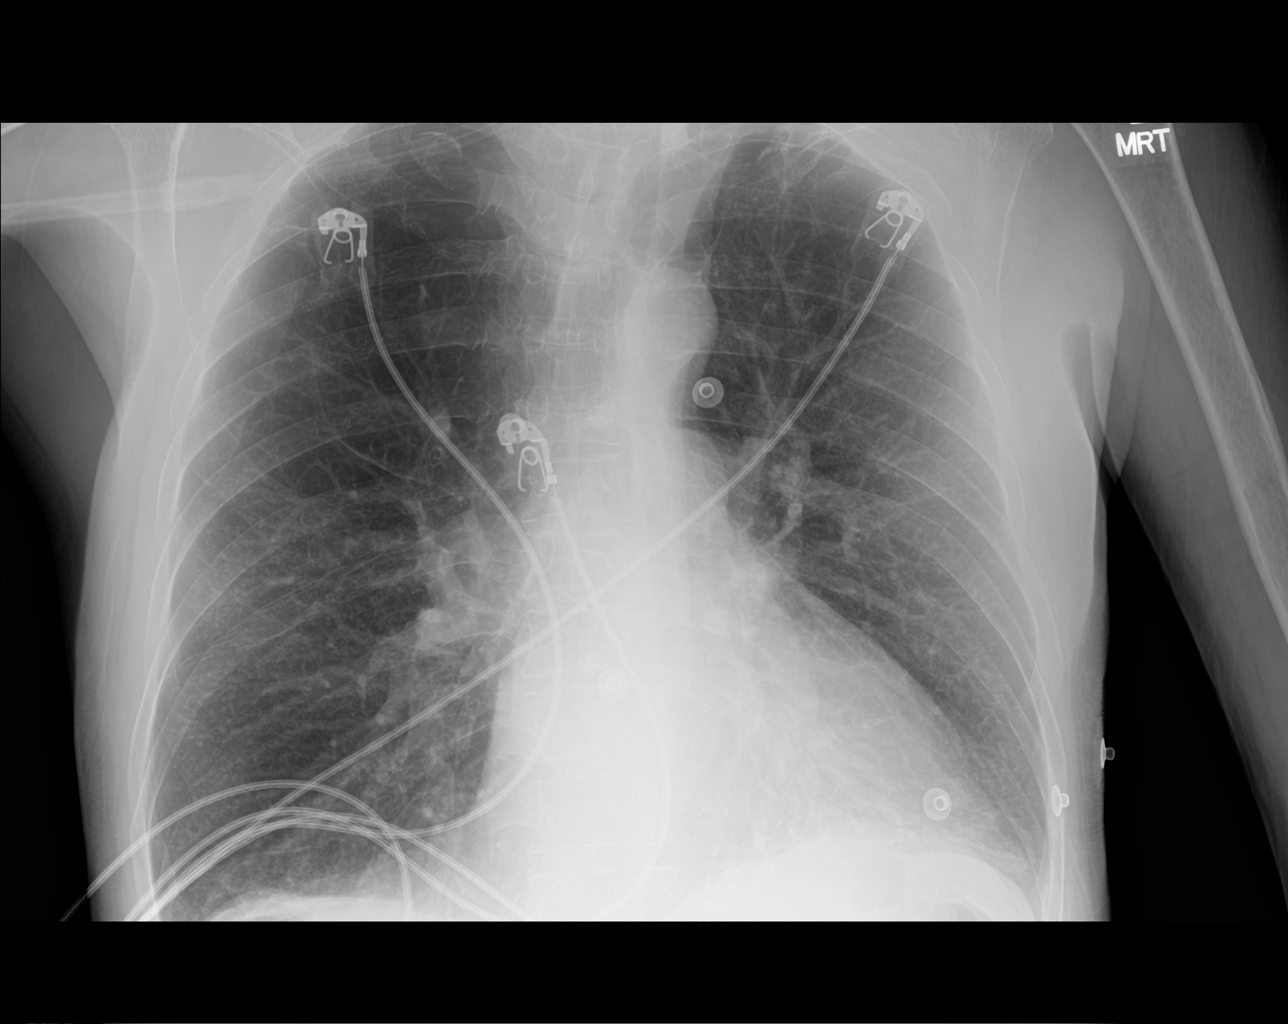

[chest lat (2 of 2)]
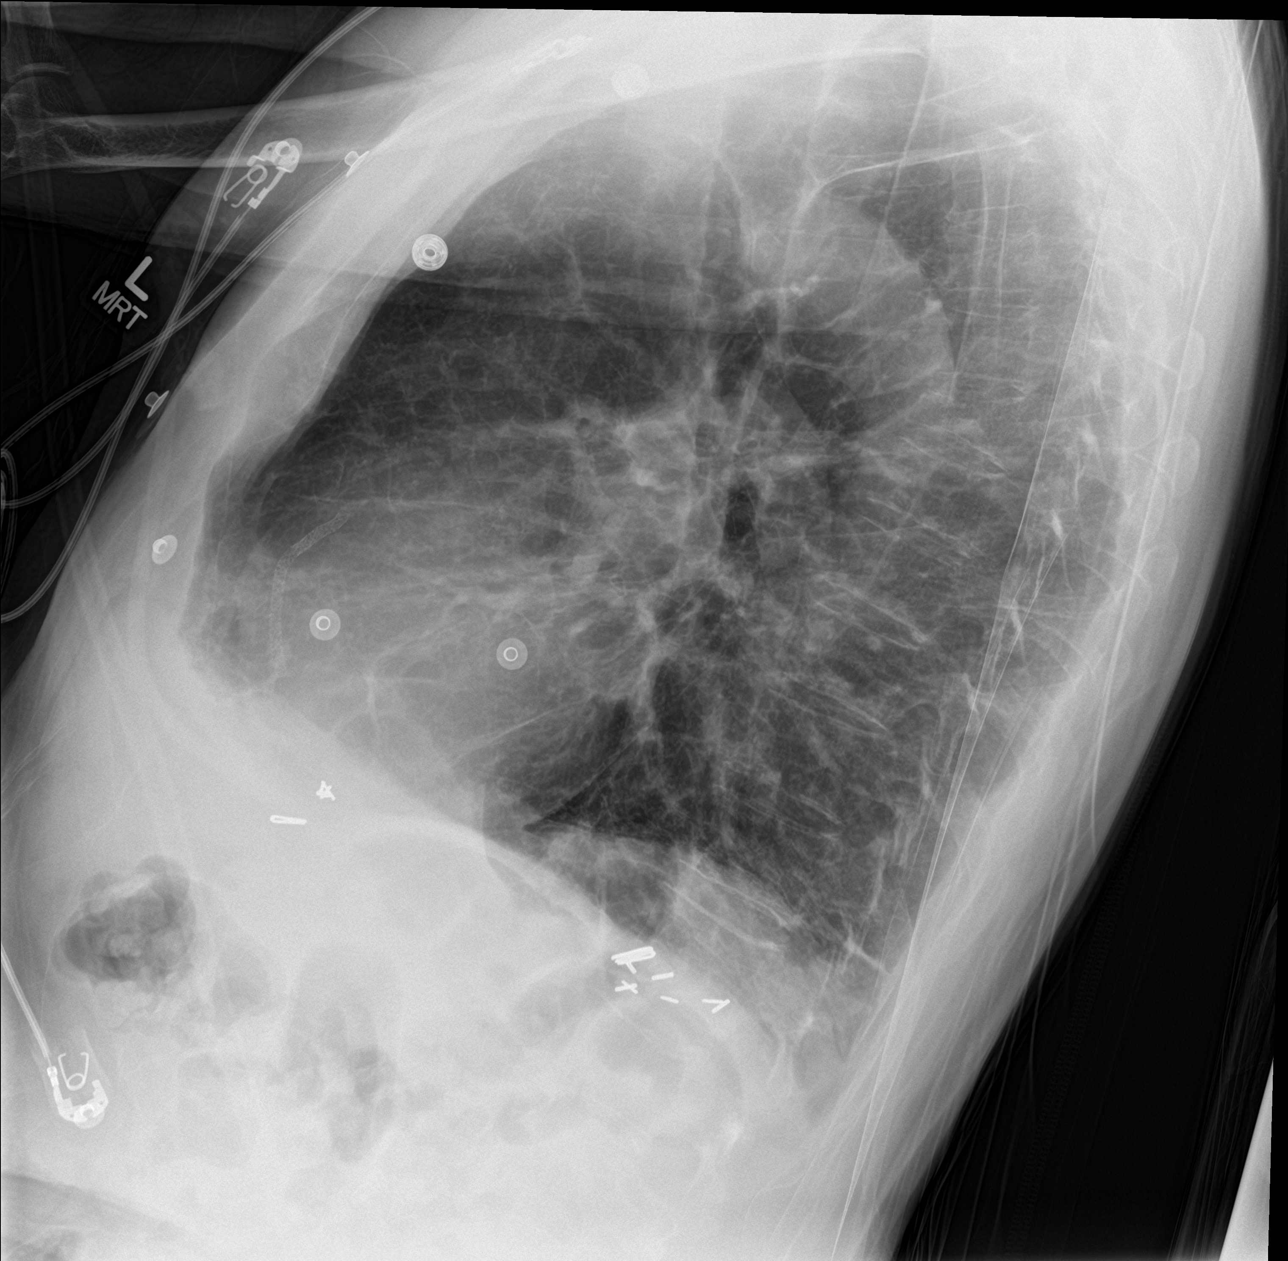

[3 of 3 positions shown; findings below may reference images not displayed]

FINDINGS: The mediastinal contour is normal. The heart size is mildly
enlarged. The lungs are hyperinflated. Mild atelectasis of left lung
base is noted. There is no focal pneumonia or pleural effusion. The
visualized skeletal structures are unremarkable.
IMPRESSION: No active cardiopulmonary disease.  Emphysema.

## 2019-07-11 DEATH — deceased
